# Patient Record
Sex: Male | Born: 1950 | Race: White | Hispanic: No | Marital: Married | Smoking: Never smoker
Health system: Southern US, Community
[De-identification: ages and names within clinical notes are randomized; demographics above are authoritative.]

## PROBLEM LIST (undated history)

## (undated) DIAGNOSIS — J189 Pneumonia, unspecified organism: Secondary | ICD-10-CM

## (undated) DIAGNOSIS — M1990 Unspecified osteoarthritis, unspecified site: Secondary | ICD-10-CM

## (undated) DIAGNOSIS — C801 Malignant (primary) neoplasm, unspecified: Secondary | ICD-10-CM

## (undated) DIAGNOSIS — E785 Hyperlipidemia, unspecified: Secondary | ICD-10-CM

## (undated) HISTORY — PX: TONSILLECTOMY: SUR1361

---

## 2018-12-29 ENCOUNTER — Ambulatory Visit (INDEPENDENT_AMBULATORY_CARE_PROVIDER_SITE_OTHER): Payer: Medicare Other | Admitting: Family Medicine

## 2018-12-29 ENCOUNTER — Other Ambulatory Visit: Payer: Self-pay

## 2018-12-29 ENCOUNTER — Ambulatory Visit: Payer: Self-pay

## 2018-12-29 ENCOUNTER — Encounter: Payer: Self-pay | Admitting: *Deleted

## 2018-12-29 VITALS — BP 142/88 | Ht 72.0 in | Wt 193.0 lb

## 2018-12-29 DIAGNOSIS — S8001XA Contusion of right knee, initial encounter: Secondary | ICD-10-CM | POA: Diagnosis not present

## 2018-12-29 DIAGNOSIS — M25561 Pain in right knee: Secondary | ICD-10-CM

## 2018-12-29 DIAGNOSIS — S8000XA Contusion of unspecified knee, initial encounter: Secondary | ICD-10-CM | POA: Insufficient documentation

## 2018-12-29 NOTE — Assessment & Plan Note (Signed)
I believe the patient does have more of a patella contusion.  Discussed with patient in great length.  We discussed if necessary oral anti-inflammatories and icing regimen.  We discussed vitamin D supplementation.  Discussed which activities of doing which was to avoid.  If patient has worsening pain he will give [place] a call but I be surprised if we see anything significant.

## 2018-12-29 NOTE — Progress Notes (Signed)
[name] [place], [place] [address] Phone: [phone] Subjective:   Daniel [name], am serving as a scribe for Dr. Hulan Saas.   CC: right knee pain   [id number]    Daniel [name] is a 68 y.o. male coming in with complaint of right knee pain. Woke up on Sunday night in pain. Anterior pain with flexion. Played golf on Saturday but always plays golf on Saturdays. [name] history of knee pain/injury. Has not been using NSAIDs.       History reviewed. [name] pertinent past medical history. History reviewed. [name] pertinent surgical history. Social History   Socioeconomic History  . Marital status: Married    Spouse name: Not on file  . Number of children: Not on file  . Years of education: Not on file  . Highest education level: Not on file  Occupational History  . Not on file  Social Needs  . Financial resource strain: Not on file  . Food insecurity:    Worry: Not on file    Inability: Not on file  . Transportation needs:    Medical: Not on file    Non-medical: Not on file  Tobacco Use  . Smoking status: Not on file  Substance and Sexual Activity  . Alcohol use: Not on file  . Drug use: Not on file  . Sexual activity: Not on file  Lifestyle  . Physical activity:    Days per week: Not on file    Minutes per session: Not on file  . Stress: Not on file  Relationships  . Social connections:    Talks on phone: Not on file    Gets together: Not on file    Attends religious service: Not on file    Active member of club or organization: Not on file    Attends meetings of clubs or organizations: Not on file    Relationship status: Not on file  Other Topics Concern  . Not on file  Social History Narrative  . Not on file   Not on File History reviewed. [name] pertinent family history.      Current Outpatient Medications (Hematological):  .  cyanocobalamin 1000 MCG tablet, Take 1,000 mcg by mouth daily.  Current Outpatient  Medications (Other):  Marland Kitchen  Multiple Vitamin (MULTIVITAMIN) tablet, Take 1 tablet by mouth daily. .  Red Yeast Rice Extract (RED YEAST RICE PO), Take by mouth. .  Vitamin D, Cholecalciferol, 25 MCG (1000 UT) TABS, Take by mouth.    Past medical history, social, surgical and family history all reviewed in electronic medical record.  [name] pertanent information unless stated regarding to the chief complaint.   Review of Systems:  [name] headache, visual changes, nausea, vomiting, diarrhea, constipation, dizziness, abdominal pain, skin rash, fevers, chills, night sweats, weight loss, swollen lymph nodes, body aches, joint swelling, muscle aches, chest pain, shortness of breath, mood changes.   Objective  Blood pressure (!) 142/88, height 6' (1.829 m), weight 193 lb (87.5 kg). Systems examined below as of    General: [name] apparent distress alert and oriented x3 mood and affect normal, dressed appropriately.  HEENT: Pupils equal, extraocular movements intact  Respiratory: Patient's speak in full sentences and does not appear short of breath  Cardiovascular: [name] lower extremity edema, non tender, [name] erythema  Skin: Warm dry intact with [name] signs of infection or rash on extremities or on axial skeleton.  Abdomen: Soft nontender  Neuro: Cranial nerves II through XII  are intact, neurovascularly intact in all extremities with 2+ DTRs and 2+ pulses.  Lymph: [name] lymphadenopathy of posterior or anterior cervical chain or axillae bilaterally.  Gait normal with good balance and coordination.  MSK:  Non tender with full range of motion and good stability and symmetric strength and tone of shoulders, elbows, wrist, hip and ankles bilaterally.  Knee:right  Normal to inspection with [name] erythema or effusion or obvious bony abnormalities. Palpation normal with [name] warmth, joint line tenderness, patellar tenderness, or condyle tenderness. ROM full in flexion and extension and lower leg rotation. Ligaments with solid  consistent endpoints including ACL, PCL, LCL, MCL. Negative Mcmurray's, Apley's, and Thessalonian tests. painful patellar compression. Patellar glide with moderatecrepitus. Patellar and quadriceps tendons unremarkable. Hamstring and quadriceps strength is normal. Contralateral knee unremarkable  Limited musculoskeletal ultrasound was performed and interpreted by [name]  Right knee exam shows the patient does have what appears to be a very small contusion of the patella noted.  [name] cortical defect noted.  Mild increase in Doppler flow.  Significant irritation of the fat pad also noted.    Impression and Recommendations:     This case required medical decision making of moderate complexity. The above documentation has been reviewed and is accurate and complete [name], DO       Note: This dictation was prepared with Dragon dictation along with smaller phrase technology. Any transcriptional errors that result from this process are unintentional.
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
## 2019-04-16 ENCOUNTER — Ambulatory Visit: Payer: Self-pay

## 2019-04-16 ENCOUNTER — Other Ambulatory Visit: Payer: Self-pay

## 2019-04-16 ENCOUNTER — Ambulatory Visit (INDEPENDENT_AMBULATORY_CARE_PROVIDER_SITE_OTHER): Payer: Medicare Other | Admitting: Family Medicine

## 2019-04-16 VITALS — BP 122/88 | HR 101 | Ht 72.0 in

## 2019-04-16 DIAGNOSIS — M7711 Lateral epicondylitis, right elbow: Secondary | ICD-10-CM | POA: Diagnosis not present

## 2019-04-16 DIAGNOSIS — M25521 Pain in right elbow: Secondary | ICD-10-CM | POA: Diagnosis not present

## 2019-04-16 NOTE — Progress Notes (Signed)
[name] [place], [place] [address] Phone: [phone] Subjective:   Daniel [name], am serving as a scribe for Dr. Hulan [name].   CC: Right elbow pain  [id number]     Update 04/16/2019 Daniel [name] is a 68 y.o. male coming in with complaint of right knee pain. Patient states his knee pain is good.   Patient is now having right elbow pain. Pain over lateral epicondyle for one month.Pain began after fly fishing after moving arm into casting position: horizontal adduction, into elbow extension and wrist extension. Notices pain with weighted activity. Denies any radiating symptoms.     [name] past medical history on file. [name] past surgical history on file. Social History   Socioeconomic History  . Marital status: Married    Spouse name: Not on file  . Number of children: Not on file  . Years of education: Not on file  . Highest education level: Not on file  Occupational History  . Not on file  Social Needs  . Financial resource strain: Not on file  . Food insecurity    Worry: Not on file    Inability: Not on file  . Transportation needs    Medical: Not on file    Non-medical: Not on file  Tobacco Use  . Smoking status: Not on file  Substance and Sexual Activity  . Alcohol use: Not on file  . Drug use: Not on file  . Sexual activity: Not on file  Lifestyle  . Physical activity    Days per week: Not on file    Minutes per session: Not on file  . Stress: Not on file  Relationships  . Social Herbalist on phone: Not on file    Gets together: Not on file    Attends religious service: Not on file    Active member of club or organization: Not on file    Attends meetings of clubs or organizations: Not on file    Relationship status: Not on file  Other Topics Concern  . Not on file  Social History Narrative  . Not on file   Not on File [name] family history on file.      Current Outpatient Medications  (Hematological):  .  cyanocobalamin 1000 MCG tablet, Take 1,000 mcg by mouth daily.  Current Outpatient Medications (Other):  Marland Kitchen  Multiple Vitamin (MULTIVITAMIN) tablet, Take 1 tablet by mouth daily. .  Red Yeast Rice Extract (RED YEAST RICE PO), Take by mouth. .  Vitamin D, Cholecalciferol, 25 MCG (1000 UT) TABS, Take by mouth.    Past medical history, social, surgical and family history all reviewed in electronic medical record.  [name] pertanent information unless stated regarding to the chief complaint.   Review of Systems:  [name] headache, visual changes, nausea, vomiting, diarrhea, constipation, dizziness, abdominal pain, skin rash, fevers, chills, night sweats, weight loss, swollen lymph nodes, body aches, joint swelling, muscle aches, chest pain, shortness of breath, mood changes.   Objective  Blood pressure 122/88, pulse (!) 101, height 6' (1.829 m), SpO2 98 %.   General: [name] apparent distress alert and oriented x3 mood and affect normal, dressed appropriately.  HEENT: Pupils equal, extraocular movements intact  Respiratory: Patient's speak in full sentences and does not appear short of breath  Cardiovascular: [name] lower extremity edema, non tender, [name] erythema  Skin: Warm dry intact with [name] signs of infection or rash on extremities or on axial skeleton.  Abdomen: Soft nontender  Neuro: Cranial nerves II through XII are intact, neurovascularly intact in all extremities with 2+ DTRs and 2+ pulses.  Lymph: [name] lymphadenopathy of posterior or anterior cervical chain or axillae bilaterally.  Gait normal with good balance and coordination.  MSK:  Non tender with full range of motion and good stability and symmetric strength and tone of shoulders,  wrist, hip, knee and ankles bilaterally.  Mild arthritic changes Right elbow tender to palpation over the lateral epicondylar region laterally as well as the radial nerve in the mid substance of the forearm.  Patient has some mild pain with resisted  extension of the wrist.  Good grip strength.  Full range of motion of the elbow though noted.  Limited musculoskeletal ultrasound was performed and interpreted by Daniel [name]  Limited ultrasound of the lateral epicondylar region shows the patient does have mild hypoechoic changes but [name] true tear.  Potential scar tissue from a previous tear noted.  [name] cortical defect or avulsion of the lateral epicondyle Impression: Lateral epicondylitis    Impression and Recommendations:     This case required medical decision making of moderate complexity. The above documentation has been reviewed and is accurate and complete Daniel Pulley, DO       Note: This dictation was prepared with Dragon dictation along with smaller phrase technology. Any transcriptional errors that result from this process are unintentional.

## 2019-04-16 NOTE — Patient Instructions (Signed)
Brace day and night for 2 weeks and then just at night for 2 weeks Ice 20 min 2x daily No overhand lifting Exercises 3x a week See me in 4-6 weeks

## 2019-04-17 ENCOUNTER — Encounter: Payer: Self-pay | Admitting: Family Medicine

## 2019-04-17 DIAGNOSIS — M7711 Lateral epicondylitis, right elbow: Secondary | ICD-10-CM | POA: Insufficient documentation

## 2019-04-17 NOTE — Assessment & Plan Note (Signed)
Elbow anatomy was reviewed, and tendinopathy was explained.  Pt. given a home rehab program. Start with isometrics and ROM, then a series of concentric and eccentric exercises should be done starting with no weight, work up to 1 lb, hammer, etc.  Use counterforce strap if working or using hands.  Formal PT would be beneficial. Emphasized stretching an cross-friction massage Emphasized proper palms up lifting biomechanics to unload ECRB Worsening symptoms will consider injection and formal physical therapy

## 2019-05-13 ENCOUNTER — Ambulatory Visit: Payer: Medicare Other | Admitting: Family Medicine

## 2019-05-27 ENCOUNTER — Ambulatory Visit (INDEPENDENT_AMBULATORY_CARE_PROVIDER_SITE_OTHER): Payer: Medicare Other | Admitting: Family Medicine

## 2019-05-27 ENCOUNTER — Ambulatory Visit: Payer: Self-pay

## 2019-05-27 ENCOUNTER — Other Ambulatory Visit: Payer: Self-pay

## 2019-05-27 ENCOUNTER — Encounter: Payer: Self-pay | Admitting: Family Medicine

## 2019-05-27 VITALS — BP 122/86 | HR 92 | Ht 72.0 in | Wt 196.0 lb

## 2019-05-27 DIAGNOSIS — M25521 Pain in right elbow: Secondary | ICD-10-CM | POA: Diagnosis not present

## 2019-05-27 DIAGNOSIS — M7711 Lateral epicondylitis, right elbow: Secondary | ICD-10-CM

## 2019-05-27 NOTE — Assessment & Plan Note (Signed)
Lateral epicondylitis.  Still having some discomfort, patient will have been fairly noncompliant with the home exercises and continues to the same activities that seem to be causing some of the discomfort and pain.  Patient wants to try again for another 6 weeks before more aggressive therapy.  Patient will consider the possibility of PRP.  Follow-up again in 6 weeks

## 2019-05-27 NOTE — Progress Notes (Signed)
[name] [place], [place] [address] Phone: [phone] Subjective:   Daniel [name], am serving as a scribe for Dr. Hulan Saas.  I'm seeing this patient by the request  of:    CC: Right elbow pain follow-up  [id number]   04/16/2019 Pt. given a home rehab program. Start with isometrics and ROM, then a series of concentric and eccentric exercises should be done starting with [name] weight, work up to 1 lb, hammer, etc.  Use counterforce strap if working or using hands.  Formal PT would be beneficial. Emphasized stretching an cross-friction massage Emphasized proper palms up lifting biomechanics to unload ECRB Worsening symptoms will consider injection and formal physical therapy  Update 05/27/2019 Daniel [name] is a 68 y.o. male coming in with complaint of right elbow pain. Patient states that he is doing somewhat better. Is still having pain with overhand grasping. Did golf and go trout fishing. Wears the brace when fishing. Does still have pain with backhand throws. Feels he may never get over the pain.  Patient states that he did the exercises more and did not do all the repetitive activity he thinks he would be doing better.  Would state overall maybe not as much pain with regular daily activities     [name] past medical history on file. [name] past surgical history on file. Social History   Socioeconomic History  . Marital status: Married    Spouse name: Not on file  . Number of children: Not on file  . Years of education: Not on file  . Highest education level: Not on file  Occupational History  . Not on file  Social Needs  . Financial resource strain: Not on file  . Food insecurity    Worry: Not on file    Inability: Not on file  . Transportation needs    Medical: Not on file    Non-medical: Not on file  Tobacco Use  . Smoking status: Not on file  Substance and Sexual Activity  . Alcohol use: Not on file  . Drug use: Not  on file  . Sexual activity: Not on file  Lifestyle  . Physical activity    Days per week: Not on file    Minutes per session: Not on file  . Stress: Not on file  Relationships  . Social Herbalist on phone: Not on file    Gets together: Not on file    Attends religious service: Not on file    Active member of club or organization: Not on file    Attends meetings of clubs or organizations: Not on file    Relationship status: Not on file  Other Topics Concern  . Not on file  Social History Narrative  . Not on file   Not on File [name] family history on file.      Current Outpatient Medications (Hematological):  .  cyanocobalamin 1000 MCG tablet, Take 1,000 mcg by mouth daily.  Current Outpatient Medications (Other):  Marland Kitchen  Multiple Vitamin (MULTIVITAMIN) tablet, Take 1 tablet by mouth daily. .  Red Yeast Rice Extract (RED YEAST RICE PO), Take by mouth. .  Vitamin D, Cholecalciferol, 25 MCG (1000 UT) TABS, Take by mouth.    Past medical history, social, surgical and family history all reviewed in electronic medical record.  [name] pertanent information unless stated regarding to the chief complaint.   Review of Systems:  [name] headache, visual changes, nausea, vomiting,  diarrhea, constipation, dizziness, abdominal pain, skin rash, fevers, chills, night sweats, weight loss, swollen lymph nodes, body aches, joint swelling, chest pain, shortness of breath, mood changes.  Positive muscle aches  Objective  Blood pressure 122/86, pulse 92, height 6' (1.829 m), weight 196 lb (88.9 kg), SpO2 97 %.    General: [name] apparent distress alert and oriented x3 mood and affect normal, dressed appropriately.  HEENT: Pupils equal, extraocular movements intact  Respiratory: Patient's speak in full sentences and does not appear short of breath  Cardiovascular: [name] lower extremity edema, non tender, [name] erythema  Skin: Warm dry intact with [name] signs of infection or rash on extremities or on axial  skeleton.  Abdomen: Soft nontender  Neuro: Cranial nerves II through XII are intact, neurovascularly intact in all extremities with 2+ DTRs and 2+ pulses.  Lymph: [name] lymphadenopathy of posterior or anterior cervical chain or axillae bilaterally.  Gait normal with good balance and coordination.  MSK:  Non tender with full range of motion and good stability and symmetric strength and tone of shoulders,  wrist, hip, knee and ankles bilaterally.  Right elbow still tender to palpation over the lateral epicondylar region.  [name] swelling noted.  Mild pain that is increased with resisted wrist extension  Limited musculoskeletal ultrasound was performed and interpreted by [name]  Limited ultrasound shows the patient does have some mild intrasubstance tearing noted of the lateral epicondylar region at the common extensor tendon.  [name] real retraction.  Mild increase in Doppler flow.  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   Impression and Recommendations:      The above documentation has been reviewed and is accurate and complete [name], DO       Note: This dictation was prepared with Dragon dictation along with smaller phrase technology. Any transcriptional errors that result from this process are unintentional.

## 2019-05-27 NOTE — Patient Instructions (Signed)
Read about PRP Continue everything else See me in 6 weeks but if considering PRP sooner call us

## 2019-07-13 ENCOUNTER — Encounter: Payer: Self-pay | Admitting: Family Medicine

## 2019-07-13 ENCOUNTER — Ambulatory Visit (INDEPENDENT_AMBULATORY_CARE_PROVIDER_SITE_OTHER): Payer: Medicare Other | Admitting: Family Medicine

## 2019-07-13 ENCOUNTER — Other Ambulatory Visit: Payer: Self-pay

## 2019-07-13 DIAGNOSIS — M7711 Lateral epicondylitis, right elbow: Secondary | ICD-10-CM

## 2019-07-13 NOTE — Assessment & Plan Note (Signed)
Patient has made significant improvement but continues to have some mild discomfort and pain.  We discussed with patient icing regimen, home exercise, which activities to do which wants to avoid.  Patient is to increase activity slowly over the course of the next several weeks.  Follow-up again in 4 to 8 weeks if patient is greater than 95% better though can follow-up as needed

## 2019-07-13 NOTE — Progress Notes (Signed)
[name] [place], [place] [address] Phone: [phone] Subjective:   I Daniel [name] am serving as a Education administrator for Dr. Hulan Saas.  This visit occurred during the SARS-CoV-2 public health emergency.  Safety protocols were in place, including screening questions prior to the visit, additional usage of staff PPE, and extensive cleaning of exam room while observing appropriate contact time as indicated for disinfecting solutions.    CC: Right elbow pain follow-up  [id number]   05/27/2019 Lateral epicondylitis.  Still having some discomfort, patient will have been fairly noncompliant with the home exercises and continues to the same activities that seem to be causing some of the discomfort and pain.  Patient wants to try again for another 6 weeks before more aggressive therapy.  Patient will consider the possibility of PRP.  Follow-up again in 6 weeks  07/13/2019 Daniel [name] is a 68 y.o. male coming in with complaint of right elbow pain. Patient states he is doing well.  States 90% better patient states daily activities have become much easier.     No past medical history on file. No past surgical history on file. Social History   Socioeconomic History  . Marital status: Married    Spouse name: Not on file  . Number of children: Not on file  . Years of education: Not on file  . Highest education level: Not on file  Occupational History  . Not on file  Social Needs  . Financial resource strain: Not on file  . Food insecurity    Worry: Not on file    Inability: Not on file  . Transportation needs    Medical: Not on file    Non-medical: Not on file  Tobacco Use  . Smoking status: Not on file  Substance and Sexual Activity  . Alcohol use: Not on file  . Drug use: Not on file  . Sexual activity: Not on file  Lifestyle  . Physical activity    Days per week: Not on file    Minutes per session: Not on file  . Stress: Not on file   Relationships  . Social Herbalist on phone: Not on file    Gets together: Not on file    Attends religious service: Not on file    Active member of club or organization: Not on file    Attends meetings of clubs or organizations: Not on file    Relationship status: Not on file  Other Topics Concern  . Not on file  Social History Narrative  . Not on file   Not on File No family history on file.  No family history of autoimmune    Past medical history, social, surgical and family history all reviewed in electronic medical record.  No pertanent information unless stated regarding to the chief complaint.   Review of Systems:  No headache, visual changes, nausea, vomiting, diarrhea, constipation, dizziness, abdominal pain, skin rash, fevers, chills, night sweats, weight loss, swollen lymph nodes, body aches, joint swelling, muscle aches, chest pain, shortness of breath, mood changes.   Objective  Blood pressure 120/84, pulse 83, height 6' (1.829 m), weight 198 lb (89.8 kg), SpO2 95 %. Systems examined below as of    General: No apparent distress alert and oriented x3 mood and affect normal, dressed appropriately.  HEENT: Pupils equal, extraocular movements intact  Respiratory: Patient's speak in full sentences and does not appear short of breath  Cardiovascular: No  lower extremity edema, non tender, no erythema  Skin: Warm dry intact with no signs of infection or rash on extremities or on axial skeleton.  Abdomen: Soft nontender  Neuro: Cranial nerves II through XII are intact, neurovascularly intact in all extremities with 2+ DTRs and 2+ pulses.  Lymph: No lymphadenopathy of posterior or anterior cervical chain or axillae bilaterally.  Gait normal with good balance and coordination.  MSK:  Non tender with full range of motion and good stability and symmetric strength and tone of shoulders, , wrist, hip, knee and ankles bilaterally.    Right elbow exam shows the patient is  moderately tender to palpation over the lateral epicondylar region.  Full range of motion with full flexion and extension.  Impression and Recommendations:      The above documentation has been reviewed and is accurate and complete [name], DO       Note: This dictation was prepared with Dragon dictation along with smaller phrase technology. Any transcriptional errors that result from this process are unintentional.

## 2019-09-24 ENCOUNTER — Ambulatory Visit: Payer: Medicare Other | Attending: Internal Medicine

## 2020-01-04 ENCOUNTER — Ambulatory Visit (INDEPENDENT_AMBULATORY_CARE_PROVIDER_SITE_OTHER): Payer: Medicare Other | Admitting: Family Medicine

## 2020-01-04 ENCOUNTER — Other Ambulatory Visit: Payer: Self-pay

## 2020-01-04 ENCOUNTER — Encounter: Payer: Self-pay | Admitting: Family Medicine

## 2020-01-04 DIAGNOSIS — M79661 Pain in right lower leg: Secondary | ICD-10-CM | POA: Diagnosis not present

## 2020-01-04 NOTE — Patient Instructions (Signed)
Heel lift on [facility] 1/8th-1/16th  Wear knee compression with activity Calf exercises 3 times a week Watch the back see me again in 5-6 weeks

## 2020-01-04 NOTE — Assessment & Plan Note (Signed)
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 Discussed with patient about compression, home exercise, heel lift, icing regimen. Proper shoes. We discussed different medications which patient declined at the moment. Patient work with Product/process development scientist to learn home exercises in greater detail. Patient will increase activity slowly. Follow-up with me again in 4 to 6 weeks.

## 2020-01-04 NOTE — Progress Notes (Signed)
[place] Phone: [phone] Subjective:   Daniel [name], am serving as a scribe for Dr. Hulan Saas. This visit occurred during the SARS-CoV-2 public health emergency.  Safety protocols were in place, including screening questions prior to the visit, additional usage of staff PPE, and extensive cleaning of exam room while observing appropriate contact time as indicated for disinfecting solutions.   I'm seeing this patient by the request  of:  Willey Blade, MD  CC: Right calf pain  [id number]  Daniel [name] is a 69 y.o. male coming in with complaint of hip pain. Patient states that he has been having right calf pain for 2 weeks. Was golfing and sleeping on a couch during the tournament. Has tried to golf but swing made pain worse. Was able to play on Sunday without pain. Pain is achy. Patient states that sometimes he can be without complaint pain. Does note some mild tightness at night. Denies any significant back pain on a regular basis at the moment.       [name] past medical history on file. [name] past surgical history on file. Social History   Socioeconomic History  . Marital status: Married    Spouse name: Not on file  . Number of children: Not on file  . Years of education: Not on file  . Highest education level: Not on file  Occupational History  . Not on file  Tobacco Use  . Smoking status: Not on file  Substance and Sexual Activity  . Alcohol use: Not on file  . Drug use: Not on file  . Sexual activity: Not on file  Other Topics Concern  . Not on file  Social History Narrative  . Not on file   Social Determinants of Health   Financial Resource Strain:   . Difficulty of Paying Living Expenses:   Food Insecurity:   . Worried About Charity fundraiser in the Last Year:   . Arboriculturist in the Last Year:   Transportation Needs:   . Film/video editor (Medical):   Marland Kitchen Lack of  Transportation (Non-Medical):   Physical Activity:   . Days of Exercise per Week:   . Minutes of Exercise per Session:   Stress:   . Feeling of Stress :   Social Connections:   . Frequency of Communication with Friends and Family:   . Frequency of Social Gatherings with Friends and Family:   . Attends Religious Services:   . Active Member of Clubs or Organizations:   . Attends Archivist Meetings:   Marland Kitchen Marital Status:    Not on File [name] family history on file.      Current Outpatient Medications (Hematological):  .  cyanocobalamin 1000 MCG tablet, Take 1,000 mcg by mouth daily.  Current Outpatient Medications (Other):  Marland Kitchen  Multiple Vitamin (MULTIVITAMIN) tablet, Take 1 tablet by mouth daily. .  Red Yeast Rice Extract (RED YEAST RICE PO), Take by mouth. .  Vitamin D, Cholecalciferol, 25 MCG (1000 UT) TABS, Take by mouth.   Reviewed prior external information including notes and imaging from  primary care provider As well as notes that were available from care everywhere and other healthcare systems.  Past medical history, social, surgical and family history all reviewed in electronic medical record.  [name] pertanent information unless stated regarding to the chief complaint.   Review of Systems:  [name] headache, visual changes, nausea, vomiting, diarrhea, constipation,  dizziness, abdominal pain, skin rash, fevers, chills, night sweats, weight loss, swollen lymph nodes, body aches, joint swelling, chest pain, shortness of breath, mood changes. POSITIVE muscle aches  Objective  Blood pressure 112/68, pulse 75, height 6' (1.829 m), weight 194 lb (88 kg), SpO2 99 %.   General: [name] apparent distress alert and oriented x3 mood and affect normal, dressed appropriately.  HEENT: Pupils equal, extraocular movements intact  Respiratory: Patient's speak in full sentences and does not appear short of breath  Cardiovascular: [name] lower extremity edema, non tender, [name] erythema  Neuro:  Cranial nerves II through XII are intact, neurovascularly intact in all extremities with 2+ DTRs and 2+ pulses.  Gait normal with good balance and coordination.  MSK:  Non tender with full range of motion and good stability and symmetric strength and tone of shoulders, elbows, wrist, hip, knee and ankles bilaterally.  Back exam has some very mild loss of lordosis. Negative FABER test. Patient does have some mild pain on the lateral gastroc head. Achilles is intact. Minorly stiff compared to the contralateral side Right calf minimally tender in the lateral gastroc tendon. Fibular head is nontender. Mild positive Tinel's. [name] foot drop noted. 5 out of 5 strength of the ankle noted.  [id number]; 15 additional minutes spent for Therapeutic exercises as stated in above notes.  This included exercises focusing on stretching, strengthening, with significant focus on eccentric aspects.   Long term goals include an improvement in range of motion, strength, endurance as well as avoiding reinjury. Patient's frequency would include in 1-2 times a day, 3-5 times a week for a duration of 6-12 weeks. Ankle strengthening that included:  Basic range of motion exercises to allow proper full motion at ankle Stretching of the lower leg and hamstrings  Theraband exercises for the lower leg - inversion, eversion, dorsiflexion and plantarflexion each to be completed with a theraband Balance exercises to increase proprioception Weight bearing exercises to increase strength and balance  Proper technique shown and discussed handout in great detail with ATC.  All questions were discussed and answered.     Impression and Recommendations:     This case required medical decision making of moderate complexity. The above documentation has been reviewed and is accurate and complete Daniel Pulley, DO       Note: This dictation was prepared with Dragon dictation along with smaller phrase technology. Any transcriptional errors that  result from this process are unintentional.

## 2020-02-08 ENCOUNTER — Encounter: Payer: Self-pay | Admitting: Family Medicine

## 2020-02-08 ENCOUNTER — Ambulatory Visit (INDEPENDENT_AMBULATORY_CARE_PROVIDER_SITE_OTHER): Payer: Medicare Other | Admitting: Family Medicine

## 2020-02-08 ENCOUNTER — Other Ambulatory Visit: Payer: Self-pay

## 2020-02-08 DIAGNOSIS — G5701 Lesion of sciatic nerve, right lower limb: Secondary | ICD-10-CM | POA: Diagnosis not present

## 2020-02-08 DIAGNOSIS — M79661 Pain in right lower leg: Secondary | ICD-10-CM

## 2020-02-08 NOTE — Assessment & Plan Note (Signed)
I believe the patient's right calf pain is more secondary to the piriformis.  We have given some different exercises.  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  Discussed which activities to do which wants to avoid.  Discussed icing regimen.  Patient will increase activity slowly.  Follow-up again in 4-8 weeks.

## 2020-02-08 NOTE — Progress Notes (Signed)
[place] Phone: [phone] Subjective:   Daniel [name], am serving as a scribe for Dr. Hulan Saas. This visit occurred during the SARS-CoV-2 public health emergency.  Safety protocols were in place, including screening questions prior to the visit, additional usage of staff PPE, and extensive cleaning of exam room while observing appropriate contact time as indicated for disinfecting solutions.   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 Discussed with patient about compression, home exercise, heel lift, icing regimen. Proper shoes. We discussed different medications which patient declined at the moment. Patient work with Product/process development scientist to learn home exercises in greater detail. Patient will increase activity slowly. Follow-up with me again in 4 to 6 weeks.  Update 02/08/2020 Daniel [name] is a 69 y.o. male coming in with complaint of right calf pain. States that he hs intermittent pain. IF sitting in hard chair will feel pain in glute all the way to the calf.  Patient states that the sitting seems to be what aggravates it more than actually the activity now.  Patient denies any numbness or tingling that stays around for long amount of time.  Sleeping comfortably.  Patient is working out on a regular basis as well and feels like overall doing decent.     [name] past medical history on file. [name] past surgical history on file. Social History   Socioeconomic History  . Marital status: Married    Spouse name: Not on file  . Number of children: Not on file  . Years of education: Not on file  . Highest education level: Not on file  Occupational History  . Not on file  Tobacco Use  . Smoking status: Not on file  Substance and Sexual Activity  . Alcohol use: Not on file  .  Drug use: Not on file  . Sexual activity: Not on file  Other Topics Concern  . Not on file  Social History Narrative  . Not on file   Social Determinants of Health   Financial Resource Strain:   . Difficulty of Paying Living Expenses:   Food Insecurity:   . Worried About Charity fundraiser in the Last Year:   . Arboriculturist in the Last Year:   Transportation Needs:   . Film/video editor (Medical):   Marland Kitchen Lack of Transportation (Non-Medical):   Physical Activity:   . Days of Exercise per Week:   . Minutes of Exercise per Session:   Stress:   . Feeling of Stress :   Social Connections:   . Frequency of Communication with Friends and Family:   . Frequency of Social Gatherings with Friends and Family:   . Attends Religious Services:   . Active Member of Clubs or Organizations:   . Attends Archivist Meetings:   Marland Kitchen Marital Status:    Not on File [name] family history on file.      Current Outpatient Medications (Hematological):  .  cyanocobalamin 1000 MCG tablet, Take 1,000 mcg by mouth daily.  Current Outpatient Medications (Other):  Marland Kitchen  Multiple Vitamin (MULTIVITAMIN) tablet, Take 1 tablet by mouth daily. .  Red Yeast Rice Extract (RED YEAST RICE PO), Take by mouth. .  Vitamin D, Cholecalciferol, 25 MCG (1000 UT) TABS, Take by mouth.  Reviewed prior external information including notes and imaging from  primary care provider As well as notes that were available from care everywhere and other healthcare systems.  Past medical history, social, surgical and family history all reviewed in electronic medical record.  [name] pertanent information unless stated regarding to the chief complaint.   Review of Systems:  [name] headache, visual changes, nausea, vomiting, diarrhea, constipation, dizziness, abdominal pain, skin rash, fevers, chills, night sweats, weight loss, swollen lymph nodes, body aches, joint swelling, chest pain, shortness of breath, mood changes.  POSITIVE muscle aches  Objective  Blood pressure 118/74, pulse 93, height 6' (1.829 m), weight 196 lb (88.9 kg), SpO2 96 %.   General: [name] apparent distress alert and oriented x3 mood and affect normal, dressed appropriately.  HEENT: Pupils equal, extraocular movements intact  Respiratory: Patient's speak in full sentences and does not appear short of breath  Cardiovascular: [name] lower extremity edema, non tender, [name] erythema  Neuro: Cranial nerves II through XII are intact, neurovascularly intact in all extremities with 2+ DTRs and 2+ pulses.  Gait normal with good balance and coordination.  MSK:  tender with full range of motion and stability and symmetric strength and tone of shoulders, elbows, wrist, hip, knee and ankles bilaterally.  Back exam shows the patient does have some tightness noted in the piriformis with a positive Corky Sox on the right side.  Patient's right calf appears to be fairly unremarkable.  Patient does have very mild tightness of the lateral gastroc head compared to the contralateral side.  Patient able to jump up and down.  Full range of motion of the ankle noted as well as the knee.   Impression and Recommendations:     The above documentation has been reviewed and is accurate and complete [name], DO       Note: This dictation was prepared with Dragon dictation along with smaller phrase technology. Any transcriptional errors that result from this process are unintentional.

## 2020-02-08 NOTE — Assessment & Plan Note (Signed)
Using Netter's Orthopaedic Anatomy, reviewed with the patient the structures involved and how they related to diagnosis. The patient indicated understanding.   The patient was given a handout about classic piriformis stretching including Pigeon Pose, Modified Pigeon Pose, my self-described "Sink Stretch," and other piriformis rehab.  We also reviewed hip flexor and abductor strengthening, ham stretching  Rec deep massage, explained self-massage with ball  

## 2020-02-08 NOTE — Patient Instructions (Signed)
Exercises  See me in 6 weeks

## 2020-03-21 ENCOUNTER — Other Ambulatory Visit: Payer: Self-pay

## 2020-03-21 ENCOUNTER — Ambulatory Visit (INDEPENDENT_AMBULATORY_CARE_PROVIDER_SITE_OTHER): Payer: Medicare Other | Admitting: Family Medicine

## 2020-03-21 ENCOUNTER — Encounter: Payer: Self-pay | Admitting: Family Medicine

## 2020-03-21 DIAGNOSIS — G5701 Lesion of sciatic nerve, right lower limb: Secondary | ICD-10-CM | POA: Diagnosis not present

## 2020-03-21 NOTE — Assessment & Plan Note (Signed)
Patient is doing well with conservative therapy.  No significant change in management.  Patient can follow-up in 6 to 12 weeks

## 2020-03-21 NOTE — Patient Instructions (Signed)
See me again in 6 weeks 

## 2020-03-21 NOTE — Progress Notes (Signed)
[place] Phone: [phone] Subjective:   [name], am serving as a scribe for Dr. Hulan Saas. This visit occurred during the SARS-CoV-2 public health emergency.  Safety protocols were in place, including screening questions prior to the visit, additional usage of staff PPE, and extensive cleaning of exam room while observing appropriate contact time as indicated for disinfecting solutions.   I'm seeing this patient by the request  of:  Willey Blade, MD  CC: Right hip and calf pain follow-up  [id number]   02/08/2020 Using Netter's Orthopaedic Anatomy, reviewed with the patient the structures involved and how they related to diagnosis. The patient indicated understanding.   The patient was given a handout about classic piriformis stretching including [employer], Modified [employer], my self-described "Sink Stretch," and other piriformis rehab.  We also reviewed hip flexor and abductor strengthening, ham stretching  Rec deep massage, explained self-massage with ball  I believe the patient's right calf pain is more secondary to the piriformis.  We have given some different exercises.  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  Discussed which activities to do which wants to avoid.  Discussed icing regimen.  Patient will increase activity slowly.  Follow-up again in 4-8 weeks.  Update 03/21/2020 Daniel [name] is a 69 y.o. male coming in with complaint of right hip and right calf pain. Patient states that his pain increases with sitting but otherwise is doing well.  Doing relatively well.  Feels like he is making progress.  Responding more to the piriformis activities at the moment.     No past medical history on file. No past surgical history on file. Social History   Socioeconomic History  . Marital status: Married    Spouse name:  Not on file  . Number of children: Not on file  . Years of education: Not on file  . Highest education level: Not on file  Occupational History  . Not on file  Tobacco Use  . Smoking status: Not on file  Substance and Sexual Activity  . Alcohol use: Not on file  . Drug use: Not on file  . Sexual activity: Not on file  Other Topics Concern  . Not on file  Social History Narrative  . Not on file   Social Determinants of Health   Financial Resource Strain:   . Difficulty of Paying Living Expenses:   Food Insecurity:   . Worried About Charity fundraiser in the Last Year:   . Arboriculturist in the Last Year:   Transportation Needs:   . Film/video editor (Medical):   Marland Kitchen Lack of Transportation (Non-Medical):   Physical Activity:   . Days of Exercise per Week:   . Minutes of Exercise per Session:   Stress:   . Feeling of Stress :   Social Connections:   . Frequency of Communication with Friends and Family:   . Frequency of Social Gatherings with Friends and Family:   . Attends Religious Services:   . Active Member of Clubs or Organizations:   . Attends Archivist Meetings:   Marland Kitchen Marital Status:    Not on File No family history on file.      Current Outpatient Medications (Hematological):  .  cyanocobalamin 1000 MCG tablet, Take 1,000 mcg by mouth daily.  Current Outpatient Medications (Other):  .  Multiple Vitamin (MULTIVITAMIN) tablet, Take 1 tablet by mouth daily. .  Red Yeast Rice Extract (RED YEAST RICE PO), Take by mouth. .  Vitamin D, Cholecalciferol, 25 MCG (1000 UT) TABS, Take by mouth.   Reviewed prior external information including notes and imaging from  primary care provider As well as notes that were available from care everywhere and other healthcare systems.  Past medical history, social, surgical and family history all reviewed in electronic medical record.  No pertanent information unless stated regarding to the chief complaint.    Review of Systems:  No headache, visual changes, nausea, vomiting, diarrhea, constipation, dizziness, abdominal pain, skin rash, fevers, chills, night sweats, weight loss, swollen lymph nodes, body aches, joint swelling, chest pain, shortness of breath, mood changes. POSITIVE muscle aches  Objective  Blood pressure 130/82, pulse 79, height 6' (1.829 m), SpO2 97 %.   General: No apparent distress alert and oriented x3 mood and affect normal, dressed appropriately.  HEENT: Pupils equal, extraocular movements intact  Respiratory: Patient's speak in full sentences and does not appear short of breath  Cardiovascular: No lower extremity edema, non tender, no erythema  Neuro: Cranial nerves II through XII are intact, neurovascularly intact in all extremities with 2+ DTRs and 2+ pulses.  Gait normal with good balance and coordination.  MSK:  Non tender with full range of motion and good stability and symmetric strength and tone of shoulders, elbows, wrist, hip, knee and ankles bilaterally.  Low back mild tenderness to palpation nothing severe.  Mild tightness with Corky Sox.  Negative straight leg test.  5-5 strength in lower extremities.   Impression and Recommendations:     The above documentation has been reviewed and is accurate and complete [name], DO       Note: This dictation was prepared with Dragon dictation along with smaller phrase technology. Any transcriptional errors that result from this process are unintentional.

## 2020-05-15 ENCOUNTER — Ambulatory Visit (INDEPENDENT_AMBULATORY_CARE_PROVIDER_SITE_OTHER): Payer: Medicare Other | Admitting: Family Medicine

## 2020-05-15 ENCOUNTER — Other Ambulatory Visit: Payer: Self-pay

## 2020-05-15 ENCOUNTER — Encounter: Payer: Self-pay | Admitting: Family Medicine

## 2020-05-15 VITALS — BP 102/76 | HR 72 | Ht 72.0 in | Wt 192.0 lb

## 2020-05-15 DIAGNOSIS — G5701 Lesion of sciatic nerve, right lower limb: Secondary | ICD-10-CM

## 2020-05-15 DIAGNOSIS — M999 Biomechanical lesion, unspecified: Secondary | ICD-10-CM | POA: Diagnosis not present

## 2020-05-15 NOTE — Assessment & Plan Note (Signed)
   Decision today to treat with OMT was based on Physical Exam  After verbal consent patient was treated with HVLA, ME, techniques in lumbar and sacral areas, all areas are chronic   Patient tolerated the procedure well with improvement in symptoms  Patient given exercises, stretches and lifestyle modifications  See medications in patient instructions if given  Patient will follow up in 4-8 weeks

## 2020-05-15 NOTE — Progress Notes (Signed)
[place] Phone: [phone] Subjective:   Daniel [name], am serving as a scribe for Dr. Hulan Saas. This visit occurred during the SARS-CoV-2 public health emergency.  Safety protocols were in place, including screening questions prior to the visit, additional usage of staff PPE, and extensive cleaning of exam room while observing appropriate contact time as indicated for disinfecting solutions.   I'm seeing this patient by the request  of:  Daniel Blade, MD  CC: Right hip pain  [id number]   03/21/2020 Patient is doing well with conservative therapy.  [name] significant change in management.  Patient can follow-up in 6 to 12 weeks  Update 05/15/2020 Daniel [name] is a 69 y.o. male coming in with complaint of right hip pain. Patient states overall doing relatively well.  Some mild discomfort and pain here and there but nothing that stops him from activity.  As long as patient does the exercises he feels like he does relatively well.  Patient denies any weakness.  Denies any difficulty with sleep at the moment.    [name] past medical history on file. [name] past surgical history on file. Social History   Socioeconomic History  . Marital status: Married    Spouse name: Not on file  . Number of children: Not on file  . Years of education: Not on file  . Highest education level: Not on file  Occupational History  . Not on file  Tobacco Use  . Smoking status: Not on file  Substance and Sexual Activity  . Alcohol use: Not on file  . Drug use: Not on file  . Sexual activity: Not on file  Other Topics Concern  . Not on file  Social History Narrative  . Not on file   Social Determinants of Health   Financial Resource Strain:   . Difficulty of Paying Living Expenses: Not on file  Food Insecurity:   . Worried About Charity fundraiser in the Last Year: Not on file  . Ran Out of Food in the Last Year: Not on  file  Transportation Needs:   . Lack of Transportation (Medical): Not on file  . Lack of Transportation (Non-Medical): Not on file  Physical Activity:   . Days of Exercise per Week: Not on file  . Minutes of Exercise per Session: Not on file  Stress:   . Feeling of Stress : Not on file  Social Connections:   . Frequency of Communication with Friends and Family: Not on file  . Frequency of Social Gatherings with Friends and Family: Not on file  . Attends Religious Services: Not on file  . Active Member of Clubs or Organizations: Not on file  . Attends Archivist Meetings: Not on file  . Marital Status: Not on file   Not on File [name] family history on file.      Current Outpatient Medications (Hematological):  .  cyanocobalamin 1000 MCG tablet, Take 1,000 mcg by mouth daily.  Current Outpatient Medications (Other):  Marland Kitchen  Multiple Vitamin (MULTIVITAMIN) tablet, Take 1 tablet by mouth daily. .  Red Yeast Rice Extract (RED YEAST RICE PO), Take by mouth. .  Vitamin D, Cholecalciferol, 25 MCG (1000 UT) TABS, Take by mouth.   Reviewed prior external information including notes and imaging from  primary care provider As well as notes that were available from care everywhere and other healthcare systems.  Past medical history, social, surgical and family  history all reviewed in electronic medical record.  [name] pertanent information unless stated regarding to the chief complaint.   Review of Systems:  [name] headache, visual changes, nausea, vomiting, diarrhea, constipation, dizziness, abdominal pain, skin rash, fevers, chills, night sweats, weight loss, swollen lymph nodes, body aches, joint swelling, chest pain, shortness of breath, mood changes. POSITIVE muscle aches  Objective  Blood pressure 102/76, pulse 72, height 6' (1.829 m), weight 192 lb (87.1 kg), SpO2 98 %.   General: [name] apparent distress alert and oriented x3 mood and affect normal, dressed appropriately.  HEENT:  Pupils equal, extraocular movements intact  Respiratory: Patient's speak in full sentences and does not appear short of breath  Cardiovascular: [name] lower extremity edema, non tender, [name] erythema  Neuro: Cranial nerves II through XII are intact, neurovascularly intact in all extremities with 2+ DTRs and 2+ pulses.  Gait normal with good balance and coordination.  MSK: Back exam does have some mild loss of lordosis, some tightness noted to [facility] on the right side.  Minimal internal range of motion of the hips bilaterally.  Low back loss of lordosis noted.  Negative straight leg test 5 out of 5 strength of the lower extremities  Osteopathic findings  L2 flexed rotated and side bent right Sacrum right on right     Impression and Recommendations:     The above documentation has been reviewed and is accurate and complete [name], DO

## 2020-05-15 NOTE — Assessment & Plan Note (Signed)
Mostly piriformis syndrome, discussed that it could be some secondary to the back.  We discussed home exercises and icing regimen, which activities to do which wants to avoid..  Follow-up with me again in 4 to 8 weeks

## 2020-05-15 NOTE — Patient Instructions (Signed)
See me in 6 weeks

## 2020-06-27 ENCOUNTER — Telehealth (HOSPITAL_COMMUNITY): Payer: Self-pay

## 2020-06-27 ENCOUNTER — Other Ambulatory Visit: Payer: Self-pay | Admitting: Oncology

## 2020-06-27 ENCOUNTER — Ambulatory Visit: Payer: Medicare Other | Admitting: Family Medicine

## 2020-06-27 DIAGNOSIS — U071 COVID-19: Secondary | ICD-10-CM

## 2020-06-27 NOTE — Progress Notes (Signed)
I connected by phone with  Daniel [name] to discuss the potential use of an new treatment for mild to moderate COVID-19 viral infection in non-hospitalized patients.   This patient is a age/sex that meets the FDA criteria for Emergency Use Authorization of casirivimab\imdevimab.  Has a (+) direct SARS-CoV-2 viral test result 1. Has mild or moderate COVID-19  2. Is ? 69 years of age and weighs ? 40 kg 3. Is NOT hospitalized due to COVID-19 4. Is NOT requiring oxygen therapy or requiring an increase in baseline oxygen flow rate due to COVID-19 5. Is within 10 days of symptom onset 6. Has at least one of the high risk factor(s) for progression to severe COVID-19 and/or hospitalization as defined in EUA. ? Specific high risk criteria :No past medical history on file. ?  Risk factors include: age and BMI >25.   Sx onset 11/12 with fatigue, SOB and congestion.   Pt has not been tested for COVID but lives in household with his wife, who was confirmed COVID   positive 06/23/20; and has already had antibody infusion.  Symptom onset  06/23/20   I have spoken and communicated the following to the patient or parent/caregiver:   1. FDA has authorized the emergency use of casirivimab\imdevimab for the treatment of mild to moderate COVID-19 in adults and pediatric patients with positive results of direct SARS-CoV-2 viral testing who are 66 years of age and older weighing at least 40 kg, and who are at high risk for progressing to severe COVID-19 and/or hospitalization.   2. The significant known and potential risks and benefits of casirivimab\imdevimab, and the extent to which such potential risks and benefits are unknown.   3. Information on available alternative treatments and the risks and benefits of those alternatives, including clinical trials.   4. Patients treated with casirivimab\imdevimab should continue to self-isolate and use infection control measures (e.g., wear mask, isolate, social distance,  avoid sharing personal items, clean and disinfect "high touch" surfaces, and frequent handwashing) according to CDC guidelines.    5. The patient or parent/caregiver has the option to accept or refuse casirivimab\imdevimab .   After reviewing this information with the patient, The patient agreed to proceed with receiving casirivimab\imdevimab infusion and will be provided a copy of the Fact sheet prior to receiving the infusion.Rulon Abide, AGNP-C [phone] ([place])

## 2020-06-27 NOTE — Telephone Encounter (Signed)
Called to Discuss with patient about Covid symptoms and the use of the monoclonal antibody infusion for those with mild to moderate Covid symptoms and at a high risk of hospitalization.     Pt appears to qualify for this infusion due to co-morbid conditions and/or a member of an at-risk group in accordance with the FDA Emergency Use Authorization.    Risk factors include: age and BMI >25. Sx onset 11/12 with fatigue, SOB and congestion. Pt has not been tested for COVID but lives in household with his wife, who was confirmed COVID positive 06/23/20; and has already had antibody infusion.  Pre-screened by RN and ready for APP to call and further discuss and/or schedule appt.  Pt verbalizes understanding of estimated costs for treatment.

## 2020-06-28 ENCOUNTER — Ambulatory Visit (HOSPITAL_COMMUNITY)
Admission: RE | Admit: 2020-06-28 | Discharge: 2020-06-28 | Disposition: A | Discharge status: Home / Self Care | Payer: Medicare Other | Source: Ambulatory Visit | Attending: Pulmonary Disease | Admitting: Pulmonary Disease

## 2020-06-28 DIAGNOSIS — Z23 Encounter for immunization: Secondary | ICD-10-CM | POA: Diagnosis not present

## 2020-06-28 DIAGNOSIS — U071 COVID-19: Secondary | ICD-10-CM | POA: Diagnosis present

## 2020-06-28 MED ORDER — ALBUTEROL SULFATE HFA 108 (90 BASE) MCG/ACT IN AERS: 2.0000 | INHALATION_SPRAY | Freq: Once | RESPIRATORY_TRACT | Status: DC | PRN

## 2020-06-28 MED ORDER — FAMOTIDINE IN NACL 20-0.9 MG/50ML-% IV SOLN: 20.0000 mg | Freq: Once | INTRAVENOUS | Status: DC | PRN

## 2020-06-28 MED ORDER — METHYLPREDNISOLONE SODIUM SUCC 125 MG IJ SOLR: 125.0000 mg | Freq: Once | INTRAMUSCULAR | Status: DC | PRN

## 2020-06-28 MED ORDER — DIPHENHYDRAMINE HCL 50 MG/ML IJ SOLN: 50.0000 mg | Freq: Once | INTRAMUSCULAR | Status: DC | PRN

## 2020-06-28 MED ORDER — SOTROVIMAB 500 MG/8ML IV SOLN
500.0000 mg | Freq: Once | INTRAVENOUS | Status: AC
  Administered 2020-06-28: 500 mg via INTRAVENOUS

## 2020-06-28 MED ORDER — EPINEPHRINE 0.3 MG/0.3ML IJ SOAJ: 0.3000 mg | Freq: Once | INTRAMUSCULAR | Status: DC | PRN

## 2020-06-28 MED ORDER — SODIUM CHLORIDE 0.9 % IV SOLN: INTRAVENOUS | Status: DC | PRN

## 2020-06-28 NOTE — Discharge Instructions (Signed)

## 2020-06-28 NOTE — Progress Notes (Signed)
Diagnosis: COVID-19  Physician: Dr. Patrick Wright  Procedure: Covid Infusion Clinic Med: Sotrovimab infusion - Provided patient with sotrovimab fact sheet for patients, parents, and caregivers prior to infusion.   Complications: No immediate complications noted  Discharge: Discharged home  If after the infusion you have any questions or concerns please call the Advanced Practice Provider at [phone] 

## 2020-06-30 ENCOUNTER — Telehealth: Payer: Self-pay | Admitting: Physical Therapy

## 2020-06-30 NOTE — Telephone Encounter (Signed)
Called pt and LM for him to return call to office to re-schedule the appt he has w/ Dr. Georgina Snell on 07/03/20 due to pt being treated for Covid w/ monoclonal antibodies (most recent treatment being on 06/28/20).  Per Dr. Georgina Snell, pt needs to wait x 2 weeks to re-schedule.

## 2020-07-03 ENCOUNTER — Ambulatory Visit: Payer: Medicare Other | Admitting: Family Medicine

## 2020-07-12 ENCOUNTER — Ambulatory Visit: Payer: Medicare Other | Admitting: Family Medicine

## 2020-09-12 NOTE — Progress Notes (Signed)
[place] Phone: [phone] Subjective:   Daniel [name], am serving as a scribe for Dr. Hulan [name]. This visit occurred during the SARS-CoV-2 public health emergency.  Safety protocols were in place, including screening questions prior to the visit, additional usage of staff PPE, and extensive cleaning of exam room while observing appropriate contact time as indicated for disinfecting solutions.   I'm seeing this patient by the request  of:  Daniel Blade, MD  CC: Neck and back pain follow-up  LOV:[id number]  Daniel [name] is a 70 y.o. male coming in with complaint of back and neck pain. OMT 05/15/2020. Patient states that his pain in the same as last visit. Feels like his pain is [name] better and [name] worse. Does use HEP to help maintain flexibility. Sitting increase his pain going down right leg. Can golf with out pain.  Patient also complains of an inguinal hernia.  Has had this for years but feels like it is growing.  States that now about 5% to 10% of the time does have same symptoms.  Patient is wondering if it is now time to get it fixed  Medications patient has been prescribed: None          Reviewed prior external information including notes and imaging from previsou exam, outside providers and external EMR if available.   As well as notes that were available from care everywhere and other healthcare systems.  Past medical history, social, surgical and family history all reviewed in electronic medical record.  [name] pertanent information unless stated regarding to the chief complaint.   [name] past medical history on file.  Not on File   Review of Systems:  [name] headache, visual changes, nausea, vomiting, diarrhea, constipation, dizziness, abdominal pain, skin rash, fevers, chills, night sweats, weight loss, swollen lymph nodes, body aches, joint swelling, chest pain, shortness of breath, mood changes.  POSITIVE muscle aches  Objective  Blood pressure 104/78, pulse 77, height 6' (1.829 m), weight 197 lb (89.4 kg), SpO2 96 %.   General: [name] apparent distress alert and oriented x3 mood and affect normal, dressed appropriately.  HEENT: Pupils equal, extraocular movements intact  Respiratory: Patient's speak in full sentences and does not appear short of breath  Cardiovascular: [name] lower extremity edema, non tender, [name] erythema  Gait normal with good balance and coordination.  MSK:  Non tender with full range of motion and good stability and symmetric strength and tone of shoulders, elbows, wrist, hip, knee and ankles bilaterally.  Back -back exam does have some mild loss of lordosis.  Patient does have tightness of the [facility] on the right greater than left.  Worsening radicular symptoms with Faber's and with straight leg test.  Patient though has 5/5 strength of the lower extremity.  Osteopathic findings   L2 flexed rotated and side bent right Sacrum right on right       Assessment and Plan:  Piriformis syndrome of right side Seems to be more piriformis but patient does have some tightness of the lumbar spine.  We will get x-rays to further evaluate.  Discussed home exercises and icing regimen.  Patient still has intermittent radicular symptoms but seems to be worse with sitting that goes against of the lumbar aspect and more of the piriformis.  Patient does respond fairly well to OMT and will follow up again in 6 weeks  Inguinal hernia Patient states has had this for some time but has  grown recently and becoming minorly symptomatic.  Given different signs and symptoms and when to seek medical attention below we will refer to Daniel [name] surgery to discuss potential surgical intervention    Nonallopathic problems  Decision today to treat with OMT was based on Physical Exam  After verbal consent patient was treated with HVLA, ME, FPR techniques in  lumbar, and sacral   areas  Patient tolerated the procedure well with improvement in symptoms  Patient given exercises, stretches and lifestyle modifications  See medications in patient instructions if given  Patient will follow up in 4-8 weeks      The above documentation has been reviewed and is accurate and complete Daniel Pulley, DO       Note: This dictation was prepared with Dragon dictation along with smaller phrase technology. Any transcriptional errors that result from this process are unintentional.

## 2020-09-13 ENCOUNTER — Encounter: Payer: Self-pay | Admitting: Family Medicine

## 2020-09-13 ENCOUNTER — Other Ambulatory Visit: Payer: Self-pay

## 2020-09-13 ENCOUNTER — Ambulatory Visit (INDEPENDENT_AMBULATORY_CARE_PROVIDER_SITE_OTHER): Payer: Medicare Other

## 2020-09-13 ENCOUNTER — Ambulatory Visit (INDEPENDENT_AMBULATORY_CARE_PROVIDER_SITE_OTHER): Payer: Medicare Other | Admitting: Family Medicine

## 2020-09-13 VITALS — BP 104/78 | HR 77 | Ht 72.0 in | Wt 197.0 lb

## 2020-09-13 DIAGNOSIS — M545 Low back pain, unspecified: Secondary | ICD-10-CM

## 2020-09-13 DIAGNOSIS — M999 Biomechanical lesion, unspecified: Secondary | ICD-10-CM | POA: Diagnosis not present

## 2020-09-13 DIAGNOSIS — K4090 Unilateral inguinal hernia, without obstruction or gangrene, not specified as recurrent: Secondary | ICD-10-CM | POA: Insufficient documentation

## 2020-09-13 DIAGNOSIS — G5701 Lesion of sciatic nerve, right lower limb: Secondary | ICD-10-CM | POA: Diagnosis not present

## 2020-09-13 NOTE — Assessment & Plan Note (Signed)
Seems to be more piriformis but patient does have some tightness of the lumbar spine.  We will get x-rays to further evaluate.  Discussed home exercises and icing regimen.  Patient still has intermittent radicular symptoms but seems to be worse with sitting that goes against of the lumbar aspect and more of the piriformis.  Patient does respond fairly well to OMT and will follow up again in 6 weeks

## 2020-09-13 NOTE — Patient Instructions (Addendum)
Xray today [place] [facility]- They will call you See me again in 6 weeks

## 2020-09-13 NOTE — Assessment & Plan Note (Signed)
Patient states has had this for some time but has grown recently and becoming minorly symptomatic.  Given different signs and symptoms and when to seek medical attention below we will refer to [facility] surgery to discuss potential surgical intervention

## 2020-10-18 ENCOUNTER — Ambulatory Visit: Payer: Self-pay | Admitting: General Surgery

## 2020-10-18 NOTE — H&P (Signed)
History of Present Illness Ralene Ok MD; 10/18/2020 2:50 PM) The patient is a 70 year old male who presents with an inguinal hernia. Referred by: Hulan Saas, M.D. Chief Complaint: Large left inguinal hernia  Patient is a 69 year old male, otherwise healthy comes in with a 2 year history of a left inguinal hernia. Patient states that it slowly gotten larger over the 2 years. Patient is active, does go to the gym on a daily basis as well as plays golf. He does state that he has some discomfort sometimes with bowel movements. He's had no signs or symptoms of incarceration or strangulation.  He's had no previous abdominal surgery.     Past Surgical History Lindwood Coke, RN; 10/18/2020 2:23 PM) Tonsillectomy   Diagnostic Studies History Lindwood Coke, RN; 10/18/2020 2:23 PM) Colonoscopy  1-5 years ago  Allergies Lindwood Coke, RN; 10/18/2020 2:24 PM) No Known Drug Allergies  [10/18/2020]: Allergies Reconciled   Medication History (Diane Herrin, RN; 10/18/2020 2:29 PM) CoQ10 (50MG  Capsule, Oral) Active. Testosterone Cypionate (200MG /ML Solution, Injection) Active. Red Yeast Rice (600MG  Capsule, Oral) Active. L-Carnitine (500MG  Capsule, Oral) Active. Medications Reconciled  Social History Lindwood Coke, RN; 10/18/2020 2:23 PM) Alcohol use  Moderate alcohol use. No caffeine use  No drug use  Tobacco use  Never smoker.  Family History Lindwood Coke, RN; 10/18/2020 2:23 PM) Breast Cancer  Mother. Prostate Cancer  Father.  Other Problems Lindwood Coke, RN; 10/18/2020 2:23 PM) Arthritis  Hemorrhoids  Hypercholesterolemia  Melanoma     Review of Systems Ralene Ok MD; 10/18/2020 2:49 PM) General Not Present- Appetite Loss, Chills, Fatigue, Fever, Night Sweats, Weight Gain and Weight Loss. Skin Not Present- Change in Wart/Mole, Dryness, Hives, Jaundice, New Lesions, Non-Healing Wounds, Rash and Ulcer. HEENT Present- Ringing in the Ears and Seasonal  Allergies. Not Present- Earache, Hearing Loss, Hoarseness, Nose Bleed, Oral Ulcers, Sinus Pain, Sore Throat, Visual Disturbances, Wears glasses/contact lenses and Yellow Eyes. Respiratory Present- Snoring. Not Present- Bloody sputum, Chronic Cough, Difficulty Breathing and Wheezing. Cardiovascular Not Present- Chest Pain, Difficulty Breathing Lying Down, Leg Cramps, Palpitations, Rapid Heart Rate, Shortness of Breath and Swelling of Extremities. Gastrointestinal Present- Hemorrhoids. Not Present- Abdominal Pain, Bloating, Bloody Stool, Change in Bowel Habits, Chronic diarrhea, Constipation, Difficulty Swallowing, Excessive gas, Gets full quickly at meals, Indigestion, Nausea, Rectal Pain and Vomiting. Male Genitourinary Not Present- Blood in Urine, Change in Urinary Stream, Frequency, Impotence, Nocturia, Painful Urination, Urgency and Urine Leakage. Musculoskeletal Present- Joint Stiffness. Not Present- Back Pain, Joint Pain, Muscle Pain, Muscle Weakness and Swelling of Extremities. Neurological Not Present- Decreased Memory, Fainting, Headaches, Numbness, Seizures, Tingling, Tremor, Trouble walking and Weakness. Endocrine Not Present- Cold Intolerance, Excessive Hunger, Hair Changes, Heat Intolerance, Hot flashes and New Diabetes. Hematology Not Present- Blood Thinners, Easy Bruising, Excessive bleeding, Gland problems, HIV and Persistent Infections. All other systems negative  Vitals (Diane Herrin RN; 10/18/2020 2:31 PM) 10/18/2020 2:30 PM Weight: 191.13 lb Height: 72in Body Surface Area: 2.09 m Body Mass Index: 25.92 kg/m  Temp.: 29F  Pulse: 77 (Regular)  P.OX: 99% (Room air) BP: 142/70(Sitting, Left Arm, Standard)       Physical Exam Ralene Ok MD; 10/18/2020 2:50 PM) The physical exam findings are as follows: Note: Constitutional: No acute distress, conversant, appears stated age  Eyes: Anicteric sclerae, moist conjunctiva, no lid lag  Neck: No thyromegaly, trachea  midline, no cervical lymphadenopathy  Lungs: Clear to auscultation biilaterally, normal respiratory effot  Cardiovascular: regular rate & rhythm, no murmurs, no peripheal edema, pedal pulses 2+  GI: Soft, no masses or hepatosplenomegaly, non-tender to palpation  MSK: Normal gait, no clubbing cyanosis, edema  Skin: No rashes, palpation reveals normal skin turgor  Psychiatric: Appropriate judgment and insight, oriented to person, place, and time  Abdomen Inspection Hernias - Inguinal hernia - Left - Reducible - Left (large) .    Assessment & Plan Ralene Ok MD; 10/18/2020 2:51 PM) LEFT INGUINAL HERNIA (K40.90) Impression: 70 year old male with a large left inguinal hernia  1. The patient will like to proceed to the operating room for robotic left inguinal hernia repair with mesh.  2. I discussed with the patient the signs and symptoms of incarceration and strangulation and the need to proceed to the ER should they occur.  3. I discussed with the patient the risks and benefits of the procedure to include but not limited to: Infection, bleeding, damage to surrounding structures, possible need for further surgery, possible nerve pain, and possible recurrence. The patient was understanding and wishes to proceed.

## 2020-10-25 ENCOUNTER — Ambulatory Visit: Payer: Medicare Other | Admitting: Family Medicine

## 2021-03-09 ENCOUNTER — Other Ambulatory Visit: Payer: Self-pay

## 2021-03-09 ENCOUNTER — Encounter (HOSPITAL_COMMUNITY): Payer: Self-pay | Admitting: General Surgery

## 2021-03-09 ENCOUNTER — Ambulatory Visit: Payer: Self-pay | Admitting: General Surgery

## 2021-03-09 NOTE — Progress Notes (Addendum)
Mr. Knope denies chest pain or shortness of shortness of breath. Patient denies having any s/s of Covid in his household.  Patient denies any known exposure to Covid.   I instructed Mr. Amenta to hold vitamins and herbal products.  I instructed Mr. Lamica to shower with antibiotic soap, if it is available.  Dry off with a clean towel. Do not put lotion, powder, cologne or deodorant or makeup.No jewelry or piercings. Men may shave their face and neck. . Wear clean clothes, brush your teeth. Glasses, contact lens,dentures or partials may not be worn in the OR. If you need to wear them, please bring a case for glasses, do not wear contacts or bring a case, the hospital does not have contact cases, dentures or partials will have to be removed , make sure they are clean, we will provide a denture cup to put them in. You will need some one to drive you home and a responsible person over the age of 77 to stay with you for the first 24 hours after surgery.

## 2021-03-13 ENCOUNTER — Encounter (HOSPITAL_COMMUNITY)
Admission: RE | Disposition: A | Discharge status: Home / Self Care | Payer: Self-pay | Source: Home / Self Care | Attending: General Surgery

## 2021-03-13 ENCOUNTER — Other Ambulatory Visit: Payer: Self-pay

## 2021-03-13 ENCOUNTER — Ambulatory Visit (HOSPITAL_COMMUNITY)
Admission: RE | Admit: 2021-03-13 | Discharge: 2021-03-13 | Disposition: A | Discharge status: Home / Self Care | Payer: Medicare Other | Attending: General Surgery | Admitting: General Surgery

## 2021-03-13 ENCOUNTER — Ambulatory Visit (HOSPITAL_COMMUNITY): Payer: Medicare Other | Admitting: Certified Registered Nurse Anesthetist

## 2021-03-13 ENCOUNTER — Encounter (HOSPITAL_COMMUNITY): Payer: Self-pay | Admitting: General Surgery

## 2021-03-13 DIAGNOSIS — Z803 Family history of malignant neoplasm of breast: Secondary | ICD-10-CM | POA: Insufficient documentation

## 2021-03-13 DIAGNOSIS — Z79899 Other long term (current) drug therapy: Secondary | ICD-10-CM | POA: Insufficient documentation

## 2021-03-13 DIAGNOSIS — Z8042 Family history of malignant neoplasm of prostate: Secondary | ICD-10-CM | POA: Insufficient documentation

## 2021-03-13 DIAGNOSIS — K4090 Unilateral inguinal hernia, without obstruction or gangrene, not specified as recurrent: Secondary | ICD-10-CM | POA: Insufficient documentation

## 2021-03-13 HISTORY — DX: Hyperlipidemia, unspecified: E78.5

## 2021-03-13 HISTORY — PX: XI ROBOTIC ASSISTED INGUINAL HERNIA REPAIR WITH MESH: SHX6706

## 2021-03-13 HISTORY — DX: Unspecified osteoarthritis, unspecified site: M19.90

## 2021-03-13 HISTORY — PX: INSERTION OF MESH: SHX5868

## 2021-03-13 HISTORY — DX: Pneumonia, unspecified organism: J18.9

## 2021-03-13 HISTORY — DX: Malignant (primary) neoplasm, unspecified: C80.1

## 2021-03-13 LAB — POCT I-STAT, CHEM 8
BUN: 15 mg/dL (ref 8–23)
Calcium, Ion: 1.22 mmol/L (ref 1.15–1.40)
Chloride: 104 mmol/L (ref 98–111)
Creatinine, Ser: 0.9 mg/dL (ref 0.61–1.24)
Glucose, Bld: 118 mg/dL — ABNORMAL HIGH (ref 70–99)
HCT: 50 % (ref 39.0–52.0)
Hemoglobin: 17 g/dL (ref 13.0–17.0)
Potassium: 4.2 mmol/L (ref 3.5–5.1)
Sodium: 140 mmol/L (ref 135–145)
TCO2: 26 mmol/L (ref 22–32)

## 2021-03-13 LAB — COMPREHENSIVE METABOLIC PANEL
ALT: 63 U/L — ABNORMAL HIGH (ref 0–44)
AST: 39 U/L (ref 15–41)
Albumin: 3.8 g/dL (ref 3.5–5.0)
Alkaline Phosphatase: 47 U/L (ref 38–126)
Anion gap: 8 (ref 5–15)
BUN: 15 mg/dL (ref 8–23)
CO2: 26 mmol/L (ref 22–32)
Calcium: 9 mg/dL (ref 8.9–10.3)
Chloride: 104 mmol/L (ref 98–111)
Creatinine, Ser: 1.03 mg/dL (ref 0.61–1.24)
GFR, Estimated: >60 mL/min (ref >60)
Glucose, Bld: 114 mg/dL — ABNORMAL HIGH (ref 70–99)
Potassium: 3.9 mmol/L (ref 3.5–5.1)
Sodium: 138 mmol/L (ref 135–145)
Total Bilirubin: 1.2 mg/dL (ref 0.3–1.2)
Total Protein: 6.1 g/dL — ABNORMAL LOW (ref 6.5–8.1)

## 2021-03-13 SURGERY — REPAIR, HERNIA, INGUINAL, ROBOT-ASSISTED, LAPAROSCOPIC, USING MESH
Anesthesia: General | Site: Inguinal | Laterality: Left

## 2021-03-13 MED ORDER — LACTATED RINGERS IV SOLN: INTRAVENOUS | Status: DC

## 2021-03-13 MED ORDER — DEXAMETHASONE SODIUM PHOSPHATE 10 MG/ML IJ SOLN
INTRAMUSCULAR | Status: DC | PRN
  Administered 2021-03-13: 5 mg via INTRAVENOUS

## 2021-03-13 MED ORDER — DEXAMETHASONE SODIUM PHOSPHATE 10 MG/ML IJ SOLN
INTRAMUSCULAR | Status: AC
  Filled 2021-03-13: qty 1

## 2021-03-13 MED ORDER — ROCURONIUM BROMIDE 10 MG/ML (PF) SYRINGE
PREFILLED_SYRINGE | INTRAVENOUS | Status: DC | PRN
  Administered 2021-03-13: 60 mg via INTRAVENOUS
  Administered 2021-03-13: 30 mg via INTRAVENOUS

## 2021-03-13 MED ORDER — EPHEDRINE SULFATE 50 MG/ML IJ SOLN
INTRAMUSCULAR | Status: DC | PRN
  Administered 2021-03-13: 5 mg via INTRAVENOUS

## 2021-03-13 MED ORDER — ROCURONIUM BROMIDE 10 MG/ML (PF) SYRINGE
PREFILLED_SYRINGE | INTRAVENOUS | Status: AC
  Filled 2021-03-13: qty 10

## 2021-03-13 MED ORDER — PHENYLEPHRINE HCL-NACL 10-0.9 MG/250ML-% IV SOLN
INTRAVENOUS | Status: DC | PRN
  Administered 2021-03-13: 40 ug/min via INTRAVENOUS

## 2021-03-13 MED ORDER — SODIUM CHLORIDE 0.9 % IV SOLN
INTRAVENOUS | Status: DC | PRN
  Administered 2021-03-13: 40 mL

## 2021-03-13 MED ORDER — CHLORHEXIDINE GLUCONATE 0.12 % MT SOLN
15.0000 mL | Freq: Once | OROMUCOSAL | Status: AC
  Administered 2021-03-13: 15 mL via OROMUCOSAL
  Filled 2021-03-13: qty 15

## 2021-03-13 MED ORDER — BUPIVACAINE HCL (PF) 0.25 % IJ SOLN
INTRAMUSCULAR | Status: AC
  Filled 2021-03-13: qty 30

## 2021-03-13 MED ORDER — FENTANYL CITRATE (PF) 100 MCG/2ML IJ SOLN: 25.0000 ug | INTRAMUSCULAR | Status: DC | PRN

## 2021-03-13 MED ORDER — TRAMADOL HCL 50 MG PO TABS
50.0000 mg | ORAL_TABLET | Freq: Four times a day (QID) | ORAL | 0 refills | Status: AC | PRN
Start: 2021-03-13 — End: 2022-03-13

## 2021-03-13 MED ORDER — ONDANSETRON HCL 4 MG/2ML IJ SOLN
INTRAMUSCULAR | Status: AC
  Filled 2021-03-13: qty 2

## 2021-03-13 MED ORDER — ONDANSETRON HCL 4 MG/2ML IJ SOLN: 4.0000 mg | Freq: Four times a day (QID) | INTRAMUSCULAR | Status: DC | PRN

## 2021-03-13 MED ORDER — LIDOCAINE 2% (20 MG/ML) 5 ML SYRINGE
INTRAMUSCULAR | Status: DC | PRN
  Administered 2021-03-13: 50 mg via INTRAVENOUS

## 2021-03-13 MED ORDER — ORAL CARE MOUTH RINSE: 15.0000 mL | Freq: Once | OROMUCOSAL | Status: AC

## 2021-03-13 MED ORDER — FENTANYL CITRATE (PF) 250 MCG/5ML IJ SOLN
INTRAMUSCULAR | Status: DC | PRN
  Administered 2021-03-13 (×2): 50 ug via INTRAVENOUS
  Administered 2021-03-13: 100 ug via INTRAVENOUS

## 2021-03-13 MED ORDER — ONDANSETRON HCL 4 MG/2ML IJ SOLN
INTRAMUSCULAR | Status: DC | PRN
  Administered 2021-03-13: 4 mg via INTRAVENOUS

## 2021-03-13 MED ORDER — BUPIVACAINE LIPOSOME 1.3 % IJ SUSP
INTRAMUSCULAR | Status: AC
  Filled 2021-03-13: qty 20

## 2021-03-13 MED ORDER — PHENYLEPHRINE 40 MCG/ML (10ML) SYRINGE FOR IV PUSH (FOR BLOOD PRESSURE SUPPORT)
PREFILLED_SYRINGE | INTRAVENOUS | Status: DC | PRN
  Administered 2021-03-13: 120 ug via INTRAVENOUS

## 2021-03-13 MED ORDER — FENTANYL CITRATE (PF) 250 MCG/5ML IJ SOLN
INTRAMUSCULAR | Status: AC
  Filled 2021-03-13: qty 5

## 2021-03-13 MED ORDER — CEFAZOLIN SODIUM-DEXTROSE 2-4 GM/100ML-% IV SOLN
2.0000 g | INTRAVENOUS | Status: AC
  Administered 2021-03-13: 2 g via INTRAVENOUS
  Filled 2021-03-13: qty 100

## 2021-03-13 MED ORDER — CHLORHEXIDINE GLUCONATE CLOTH 2 % EX PADS: 6.0000 | MEDICATED_PAD | Freq: Once | CUTANEOUS | Status: DC

## 2021-03-13 MED ORDER — MIDAZOLAM HCL 2 MG/2ML IJ SOLN
INTRAMUSCULAR | Status: AC
  Filled 2021-03-13: qty 2

## 2021-03-13 MED ORDER — OXYCODONE HCL 5 MG/5ML PO SOLN: 5.0000 mg | Freq: Once | ORAL | Status: DC | PRN

## 2021-03-13 MED ORDER — LIDOCAINE 2% (20 MG/ML) 5 ML SYRINGE
INTRAMUSCULAR | Status: AC
  Filled 2021-03-13: qty 5

## 2021-03-13 MED ORDER — PROPOFOL 10 MG/ML IV BOLUS
INTRAVENOUS | Status: AC
  Filled 2021-03-13: qty 40

## 2021-03-13 MED ORDER — BUPIVACAINE HCL 0.25 % IJ SOLN
INTRAMUSCULAR | Status: DC | PRN
  Administered 2021-03-13: 4 mL

## 2021-03-13 MED ORDER — STERILE WATER FOR IRRIGATION IR SOLN
Status: DC | PRN
  Administered 2021-03-13: 1000 mL

## 2021-03-13 MED ORDER — MIDAZOLAM HCL 2 MG/2ML IJ SOLN
INTRAMUSCULAR | Status: DC | PRN
  Administered 2021-03-13: 2 mg via INTRAVENOUS

## 2021-03-13 MED ORDER — PROPOFOL 10 MG/ML IV BOLUS
INTRAVENOUS | Status: DC | PRN
  Administered 2021-03-13: 160 mg via INTRAVENOUS

## 2021-03-13 MED ORDER — 0.9 % SODIUM CHLORIDE (POUR BTL) OPTIME
TOPICAL | Status: DC | PRN
  Administered 2021-03-13: 1000 mL

## 2021-03-13 MED ORDER — SUGAMMADEX SODIUM 200 MG/2ML IV SOLN
INTRAVENOUS | Status: DC | PRN
  Administered 2021-03-13: 200 mg via INTRAVENOUS

## 2021-03-13 MED ORDER — ENSURE PRE-SURGERY PO LIQD: 296.0000 mL | Freq: Once | ORAL | Status: DC

## 2021-03-13 MED ORDER — OXYCODONE HCL 5 MG PO TABS
5.0000 mg | ORAL_TABLET | Freq: Once | ORAL | Status: DC | PRN
Start: 2021-03-13 — End: 2021-03-13

## 2021-03-13 MED ORDER — ACETAMINOPHEN 500 MG PO TABS
1000.0000 mg | ORAL_TABLET | ORAL | Status: AC
  Administered 2021-03-13: 1000 mg via ORAL
  Filled 2021-03-13: qty 2

## 2021-03-13 MED FILL — Phenylephrine-NaCl IV Solution 10 MG/250ML-0.9%: INTRAVENOUS | Qty: 250 | Status: AC

## 2021-03-13 SURGICAL SUPPLY — 45 items
CHLORAPREP W/TINT 26 (MISCELLANEOUS) ×2 IMPLANT
COVER MAYO STAND STRL (DRAPES) ×2 IMPLANT
COVER SURGICAL LIGHT HANDLE (MISCELLANEOUS) ×2 IMPLANT
COVER TIP SHEARS 8 DVNC (MISCELLANEOUS) ×1 IMPLANT
COVER TIP SHEARS 8MM DA VINCI (MISCELLANEOUS) ×1
DEFOGGER SCOPE WARMER CLEARIFY (MISCELLANEOUS) ×2 IMPLANT
DERMABOND ADVANCED (GAUZE/BANDAGES/DRESSINGS) ×1
DERMABOND ADVANCED .7 DNX12 (GAUZE/BANDAGES/DRESSINGS) ×1 IMPLANT
DEVICE TROCAR PUNCTURE CLOSURE (ENDOMECHANICALS) ×2 IMPLANT
DRAPE ARM DVNC X/XI (DISPOSABLE) ×4 IMPLANT
DRAPE COLUMN DVNC XI (DISPOSABLE) ×1 IMPLANT
DRAPE CV SPLIT W-CLR ANES SCRN (DRAPES) ×2 IMPLANT
DRAPE DA VINCI XI ARM (DISPOSABLE) ×4
DRAPE DA VINCI XI COLUMN (DISPOSABLE) ×1
DRAPE ORTHO SPLIT 77X108 STRL (DRAPES) ×1
DRAPE SURG ORHT 6 SPLT 77X108 (DRAPES) ×1 IMPLANT
ELECT REM PT RETURN 9FT ADLT (ELECTROSURGICAL) ×2
ELECTRODE REM PT RTRN 9FT ADLT (ELECTROSURGICAL) ×1 IMPLANT
GAUZE 4X4 16PLY ~~LOC~~+RFID DBL (SPONGE) ×2 IMPLANT
GLOVE SURG ENC MOIS LTX SZ7.5 (GLOVE) ×4 IMPLANT
GOWN STRL REUS W/ TWL LRG LVL3 (GOWN DISPOSABLE) ×2 IMPLANT
GOWN STRL REUS W/ TWL XL LVL3 (GOWN DISPOSABLE) ×2 IMPLANT
GOWN STRL REUS W/TWL 2XL LVL3 (GOWN DISPOSABLE) ×2 IMPLANT
GOWN STRL REUS W/TWL LRG LVL3 (GOWN DISPOSABLE) ×2
GOWN STRL REUS W/TWL XL LVL3 (GOWN DISPOSABLE) ×2
KIT BASIN OR (CUSTOM PROCEDURE TRAY) ×2 IMPLANT
KIT TURNOVER KIT B (KITS) ×2 IMPLANT
MARKER SKIN DUAL TIP RULER LAB (MISCELLANEOUS) ×2 IMPLANT
MESH PROGRIP LAP SELF FIXATING (Mesh General) ×1 IMPLANT
MESH PROGRIP LAP SLF FIX 16X12 (Mesh General) ×1 IMPLANT
NEEDLE HYPO 22GX1.5 SAFETY (NEEDLE) ×2 IMPLANT
NEEDLE INSUFFLATION 14GA 120MM (NEEDLE) ×2 IMPLANT
PAD ARMBOARD 7.5X6 YLW CONV (MISCELLANEOUS) ×4 IMPLANT
SEAL CANN UNIV 5-8 DVNC XI (MISCELLANEOUS) ×3 IMPLANT
SEAL XI 5MM-8MM UNIVERSAL (MISCELLANEOUS) ×3
SET TUBE SMOKE EVAC HIGH FLOW (TUBING) ×2 IMPLANT
SPONGE T-LAP 18X18 ~~LOC~~+RFID (SPONGE) ×2 IMPLANT
STOPCOCK 4 WAY LG BORE MALE ST (IV SETS) ×2 IMPLANT
SUT MNCRL AB 4-0 PS2 18 (SUTURE) ×2 IMPLANT
SUT VIC AB 2-0 SH 27 (SUTURE)
SUT VIC AB 2-0 SH 27X BRD (SUTURE) IMPLANT
SUT VLOC 180 2-0 9IN GS21 (SUTURE) ×2 IMPLANT
SYR 30ML SLIP (SYRINGE) ×2 IMPLANT
TOWEL GREEN STERILE FF (TOWEL DISPOSABLE) ×2 IMPLANT
TRAY LAPAROSCOPIC MC (CUSTOM PROCEDURE TRAY) ×2 IMPLANT

## 2021-03-13 NOTE — H&P (Signed)
History of Present IllnessThe patient is a 70 year old male who presents with an inguinal hernia. Referred by: Hulan Saas, M.D. Chief Complaint: Large left inguinal hernia   Patient is a 70 year old male, otherwise healthy comes in with a 2 year history of a left inguinal hernia.  Patient states that it slowly gotten larger over the 2 years.  Patient is active, does go to the gym on a daily basis as well as plays golf.  He does state that he has some discomfort sometimes with bowel movements.  He's had no signs or symptoms of incarceration or strangulation.   He's had no previous abdominal surgery.         Past Surgical History  Tonsillectomy     Diagnostic Studies History  Colonoscopy   1-5 years ago   Allergies  No Known Drug Allergies   [10/18/2020]: Allergies Reconciled     Medication History CoQ10  ('50MG'$  Capsule, Oral) Active. Testosterone Cypionate  ('200MG'$ /ML Solution, Injection) Active. Red Yeast Rice  ('600MG'$  Capsule, Oral) Active. L-Carnitine  ('500MG'$  Capsule, Oral) Active. Medications Reconciled    Social History Alcohol use   Moderate alcohol use. No caffeine use   No drug use   Tobacco use   Never smoker.   Family History  Breast Cancer   Mother. Prostate Cancer   Father.   Other Problems  Arthritis   Hemorrhoids   Hypercholesterolemia   Melanoma         Review of Systems  General Not Present- Appetite Loss, Chills, Fatigue, Fever, Night Sweats, Weight Gain and Weight Loss. Skin Not Present- Change in Wart/Mole, Dryness, Hives, Jaundice, New Lesions, Non-Healing Wounds, Rash and Ulcer. HEENT Present- Ringing in the Ears and Seasonal Allergies. Not Present- Earache, Hearing Loss, Hoarseness, Nose Bleed, Oral Ulcers, Sinus Pain, Sore Throat, Visual Disturbances, Wears glasses/contact lenses and Yellow Eyes. Respiratory Present- Snoring. Not Present- Bloody sputum, Chronic Cough, Difficulty Breathing and Wheezing. Cardiovascular Not Present- Chest Pain,  Difficulty Breathing Lying Down, Leg Cramps, Palpitations, Rapid Heart Rate, Shortness of Breath and Swelling of Extremities. Gastrointestinal Present- Hemorrhoids. Not Present- Abdominal Pain, Bloating, Bloody Stool, Change in Bowel Habits, Chronic diarrhea, Constipation, Difficulty Swallowing, Excessive gas, Gets full quickly at meals, Indigestion, Nausea, Rectal Pain and Vomiting. Male Genitourinary Not Present- Blood in Urine, Change in Urinary Stream, Frequency, Impotence, Nocturia, Painful Urination, Urgency and Urine Leakage. Musculoskeletal Present- Joint Stiffness. Not Present- Back Pain, Joint Pain, Muscle Pain, Muscle Weakness and Swelling of Extremities. Neurological Not Present- Decreased Memory, Fainting, Headaches, Numbness, Seizures, Tingling, Tremor, Trouble walking and Weakness. Endocrine Not Present- Cold Intolerance, Excessive Hunger, Hair Changes, Heat Intolerance, Hot flashes and New Diabetes. Hematology Not Present- Blood Thinners, Easy Bruising, Excessive bleeding, Gland problems, HIV and Persistent Infections. All other systems negative  BP (!) 172/87   Pulse 73   Temp (!) 97.5 F (36.4 C) (Oral)   Resp 17   Ht 6' (1.829 m)   Wt 85.3 kg   SpO2 96%   BMI 25.50 kg/m           Physical Exam  The physical exam findings are as follows: Note:   Constitutional: No acute distress, conversant, appears stated age   Eyes: Anicteric sclerae, moist conjunctiva, no lid lag   Neck: No thyromegaly, trachea midline, no cervical lymphadenopathy   Lungs: Clear to auscultation biilaterally, normal respiratory effot   Cardiovascular: regular rate & rhythm, no murmurs, no peripheal edema, pedal pulses 2+   GI: Soft, no masses or hepatosplenomegaly, non-tender  to palpation   MSK: Normal gait, no clubbing cyanosis, edema   Skin: No rashes, palpation reveals normal skin turgor   Psychiatric: Appropriate judgment and insight, oriented to person, place, and time    Abdomen Inspection Hernias - Inguinal hernia - Left - Reducible - Left  (large) .       Assessment & Plan  LEFT INGUINAL HERNIA (K40.90) Impression: 70 year old male with a large left inguinal hernia   1. The patient will like to proceed to the operating room for robotic left inguinal hernia repair with mesh.   2. I discussed with the patient the signs and symptoms of incarceration and strangulation and the need to proceed to the ER should they occur.   3. I discussed with the patient the risks and benefits of the procedure to include but not limited to: Infection, bleeding, damage to surrounding structures, possible need for further surgery, possible nerve pain, and possible recurrence. The patient was understanding and wishes to proceed.

## 2021-03-13 NOTE — Discharge Instructions (Addendum)
[facility] _______[facility] ? ?INGUINAL HERNIA REPAIR: POST OP INSTRUCTIONS ? ?Always review your discharge instruction sheet given to you by the facility where your surgery was performed. ?IF YOU HAVE DISABILITY OR FAMILY LEAVE FORMS, YOU MUST BRING THEM TO THE OFFICE FOR PROCESSING.   ?DO NOT GIVE THEM TO YOUR DOCTOR. ? ?1. A  prescription for pain medication may be given to you upon discharge.  Take your pain medication as prescribed, if needed.  If narcotic pain medicine is not needed, then you may take acetaminophen (Tylenol) or ibuprofen (Advil) as needed. ?2. Take your usually prescribed medications unless otherwise directed. ?If you need a refill on your pain medication, please contact your pharmacy.  They will contact our office to request authorization. Prescriptions will not be filled after 5 pm or on week-ends. ?3. You should follow a light diet the first 24 hours after arrival home, such as soup and crackers, etc.  Be sure to include lots of fluids daily.  Resume your normal diet the day after surgery. ?4.Most patients will experience some swelling and bruising around the umbilicus or in the groin and scrotum.  Ice packs and reclining will help.  Swelling and bruising can take several days to resolve.  ?6. It is common to experience some constipation if taking pain medication after surgery.  Increasing fluid intake and taking a stool softener (such as Colace) will usually help or prevent this problem from occurring.  A mild laxative (Milk of Magnesia or Miralax) should be taken according to package directions if there are no bowel movements after 48 hours. ?7. Unless discharge instructions indicate otherwise, you may remove your bandages 24-48 hours after surgery, and you may shower at that time.  You may have steri-strips (small skin tapes) in place directly over the incision.  These strips should be left on the skin for 7-10 days.  If your surgeon used skin glue on the incision, you may  shower in 24 hours.  The glue will flake off over the next 2-3 weeks.  Any sutures or staples will be removed at the office during your follow-up visit. ?8. ACTIVITIES:  You may resume regular (light) daily activities beginning the next day--such as daily self-care, walking, climbing stairs--gradually increasing activities as tolerated.  You may have sexual intercourse when it is comfortable.  Refrain from any heavy lifting or straining until approved by your doctor. ? ?a.You may drive when you are no longer taking prescription pain medication, you can comfortably wear a seatbelt, and you can safely maneuver your car and apply brakes. ?b.RETURN TO WORK:   ?_____________________________________________ ? ?9.You should see your doctor in the office for a follow-up appointment approximately 2-3 weeks after your surgery.  Make sure that you call for this appointment within a day or two after you arrive home to insure a convenient appointment time. ?10.OTHER INSTRUCTIONS: _________________________ ?   _____________________________________ ? ?WHEN TO CALL YOUR DOCTOR: ?Fever over 101.0 ?Inability to urinate ?Nausea and/or vomiting ?Extreme swelling or bruising ?Continued bleeding from incision. ?Increased pain, redness, or drainage from the incision ? ?The clinic staff is available to answer your questions during regular business hours.  Please don?t hesitate to call and ask to speak to one of the nurses for clinical concerns.  If you have a medical emergency, go to the nearest emergency room or call 911.  A surgeon from [facility] is always on call at the hospital ? ? ?[address], [address], [place], [place]    [address] ? ? [address] [address], [place], [place]   [address] ?[phone] ? [phone] ? FAX [phone] ?Web site: [email or web address] ? ?

## 2021-03-13 NOTE — Anesthesia Preprocedure Evaluation (Signed)
Anesthesia Evaluation  Patient identified by MRN, date of birth, ID band Patient awake    Reviewed: Allergy & Precautions, H&P , NPO status , Patient's Chart, lab work & pertinent test results  Airway Mallampati: II   Neck ROM: full    Dental   Pulmonary    breath sounds clear to auscultation       Cardiovascular negative cardio ROS   Rhythm:regular Rate:Normal     Neuro/Psych    GI/Hepatic   Endo/Other    Renal/GU      Musculoskeletal  (+) Arthritis ,   Abdominal   Peds  Hematology   Anesthesia Other Findings   Reproductive/Obstetrics                             Anesthesia Physical Anesthesia Plan  ASA: 2  Anesthesia Plan: General   Post-op Pain Management:    Induction: Intravenous  PONV Risk Score and Plan: 2 and Ondansetron, Dexamethasone, Midazolam and Treatment may vary due to age or medical condition  Airway Management Planned: Oral ETT  Additional Equipment:   Intra-op Plan:   Post-operative Plan: Extubation in OR  Informed Consent: I have reviewed the patients History and Physical, chart, labs and discussed the procedure including the risks, benefits and alternatives for the proposed anesthesia with the patient or authorized representative who has indicated his/her understanding and acceptance.     Dental advisory given  Plan Discussed with: CRNA, Anesthesiologist and Surgeon  Anesthesia Plan Comments:         Anesthesia Quick Evaluation

## 2021-03-13 NOTE — Anesthesia Procedure Notes (Signed)
Procedure Name: Intubation Date/Time: 03/13/2021 7:43 AM Performed by: Leonor Liv, CRNA Pre-anesthesia Checklist: Patient identified, Emergency Drugs available, Suction available and Patient being monitored Patient Re-evaluated:Patient Re-evaluated prior to induction Oxygen Delivery Method: Circle System Utilized Preoxygenation: Pre-oxygenation with 100% oxygen Induction Type: IV induction Ventilation: Mask ventilation without difficulty Laryngoscope Size: Mac and 4 Grade View: Grade I Tube type: Oral Tube size: 7.5 mm Number of attempts: 1 Airway Equipment and Method: Stylet and Oral airway Placement Confirmation: ETT inserted through vocal cords under direct vision, positive ETCO2 and breath sounds checked- equal and bilateral Secured at: 23 cm Tube secured with: Tape Dental Injury: Teeth and Oropharynx as per pre-operative assessment  Comments: Inserted by Paulina Fusi, SRNA

## 2021-03-13 NOTE — Transfer of Care (Signed)
Immediate Anesthesia Transfer of Care Note  Patient: Daniel [name]  Procedure(s) Performed: XI ROBOTIC ASSISTED LEFT INGUINAL HERNIA REPAIR WITH MESH (Left: Inguinal) INSERTION OF MESH (Left: Inguinal)  Patient Location: PACU  Anesthesia Type:General  Level of Consciousness: awake and alert   Airway & Oxygen Therapy: Patient Spontanous Breathing and Patient connected to face mask oxygen  Post-op Assessment: Report given to RN and Post -op Vital signs reviewed and stable  Post vital signs: Reviewed and stable  Last Vitals:  Vitals Value Taken Time  BP 118/80 03/13/21 0910  Temp 36.8 C 03/13/21 0910  Pulse 74 03/13/21 0912  Resp 25 03/13/21 0912  SpO2 95 % 03/13/21 0912  Vitals shown include unvalidated device data.  Last Pain:  Vitals:   03/13/21 0910  TempSrc:   PainSc: Asleep         Complications: No notable events documented.

## 2021-03-13 NOTE — Op Note (Signed)
03/13/2021  8:58 AM  PATIENT:  Lars Pinks  70 y.o. male  PRE-OPERATIVE DIAGNOSIS:  LEFT INGUINAL HERNIA  POST-OPERATIVE DIAGNOSIS:  LEFT INGUINAL HERNIA  PROCEDURE:  Procedure(s): XI ROBOTIC ASSISTED LEFT INGUINAL HERNIA REPAIR WITH MESH (Left) INSERTION OF MESH (Left)  SURGEON:  Surgeon(s) and Role:    Ralene Ok, MD - Primary  ANESTHESIA:   local and general  EBL:  minimal   BLOOD ADMINISTERED:none  DRAINS: none   LOCAL MEDICATIONS USED:  BUPIVICAINE   SPECIMEN:  No Specimen  DISPOSITION OF SPECIMEN:  PATHOLOGY  COUNTS:  YES  TOURNIQUET:  * No tourniquets in log *  DICTATION: .Dragon Dictation Details of the procedure:   The patient was taken back to the operating room. The patient was placed in supine position with bilateral SCDs in place.  A Foley catheter was placed.  The patient was prepped and draped in the usual sterile fashion.  After appropriate anitbiotics were confirmed, a time-out was confirmed and all facts were verified.  At this time a Veress needle technique was used to inspect the abdomen approximately 10 cm from the valgus and the paramedian line. This time a 8 mm robotic trocar was placed into the abdomen. The camera was placed there was no injury to any intra-abdominal organs. A 103m umbilical port was placed just superior to the umbilicus. An 8 mm port was placed approximately 10 cm lateral to the umbilicus on the left paramedian side.   Robot was positioned over the patient and the ports were docked in the usual fashion.  At this time the left-sided peritoneum was taken down from the medial umbilical ligament laterally. The pre-peritoneal space was entered. Dissection was taken down to Cooper's ligament. At this time it was apparent there was a large indirect hernia. At this time proceeded clean out the rest Cooper's ligament and the medial to lateral direction. I proceeded laterally to dissect the spermatic cord and hernia sac. The spermatic  cord was circumferentially dissected away from the surrounding musculature and tissue. The vas deferens was identified and protected all portions of the case. There was a large indirect hernia.  This was dissected away from the cord. This was dissected back. At this time I proceeded to create a pocket laterally for the mesh. Once the pocket was created the peritoneum was stripped back to approximately the base of the cord. At this time the piece of 16x12cm Progrip mesh was in placed into the dissected area. This covered both the direct and indirect spaces appropriately. This also covered  The femoral space. At this time a 2 V- lock stitch was used to close the peritoneum in a standard running fashion.  At this time the robot was undocked. The umbilical port site was reapproximated using a 0 Vicryl via an Endo Close device 1. All ports were removed. The skin was reapproximated and all port sites using 4-0 Monocryl subcuticular fashion.  The patient the procedure well was taken to the recovery     PLAN OF CARE: Discharge to home after PACU  PATIENT DISPOSITION:  PACU - hemodynamically stable.   Delay start of Pharmacological VTE agent (>24hrs) due to surgical blood loss or risk of bleeding: not applicable

## 2021-03-14 ENCOUNTER — Encounter (HOSPITAL_COMMUNITY): Payer: Self-pay | Admitting: General Surgery

## 2021-03-14 NOTE — Anesthesia Postprocedure Evaluation (Signed)
Anesthesia Post Note  Patient: Daniel [name]  Procedure(s) Performed: XI ROBOTIC ASSISTED LEFT INGUINAL HERNIA REPAIR WITH MESH (Left: Inguinal) INSERTION OF MESH (Left: Inguinal)     Patient location during evaluation: PACU Anesthesia Type: General Level of consciousness: awake and alert Pain management: pain level controlled Vital Signs Assessment: post-procedure vital signs reviewed and stable Respiratory status: spontaneous breathing, nonlabored ventilation, respiratory function stable and patient connected to nasal cannula oxygen Cardiovascular status: blood pressure returned to baseline and stable Postop Assessment: no apparent nausea or vomiting Anesthetic complications: no   No notable events documented.  Last Vitals:  Vitals:   03/13/21 0925 03/13/21 0940  BP: 113/82 128/68  Pulse: 68 69  Resp: 20 19  Temp:  (!) 36.3 C  SpO2: 95% 95%    Last Pain:  Vitals:   03/13/21 0940  TempSrc:   PainSc: 0-No pain                 Kyri Shader S

## 2021-04-29 ENCOUNTER — Encounter: Payer: Self-pay | Admitting: Family Medicine

## 2021-07-12 ENCOUNTER — Other Ambulatory Visit (HOSPITAL_COMMUNITY): Payer: Self-pay | Admitting: Internal Medicine

## 2021-07-31 IMAGING — DX DG LUMBAR SPINE 2-3V
3 series · 3 of 3 positions shown · non-contrast
Comparison: None.

CLINICAL DATA: Back pain, sciatica

EXAM:
LUMBAR SPINE - 2-3 VIEW

[l-spine ap]
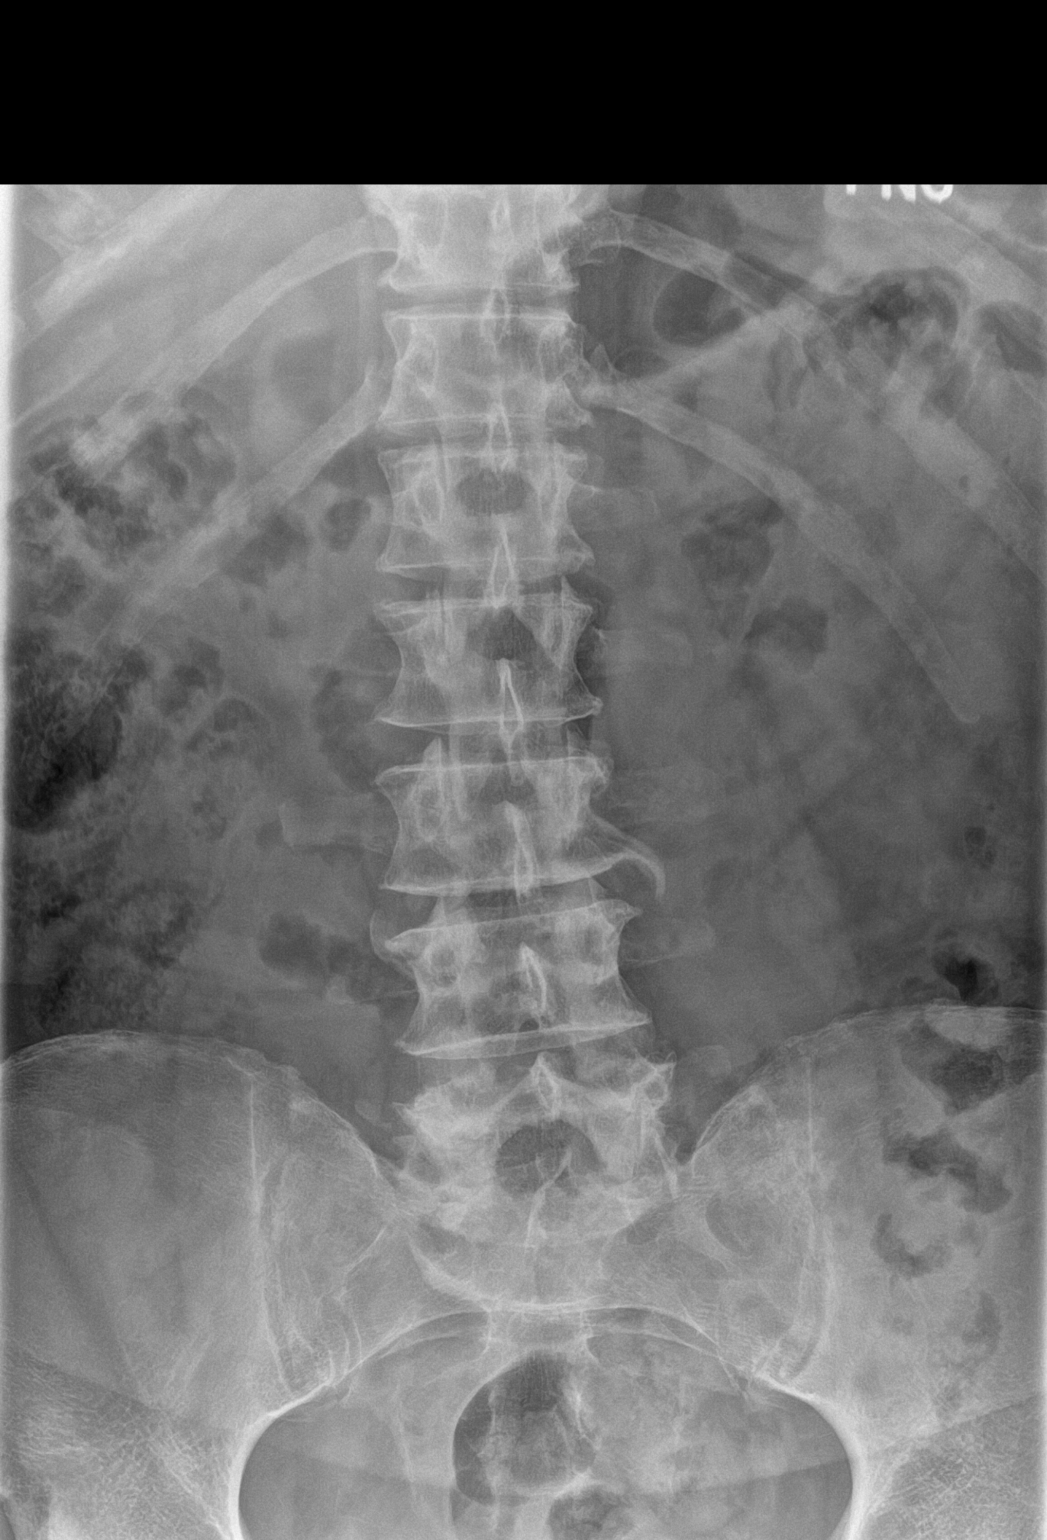

[l-spine lateral (1 of 2)]
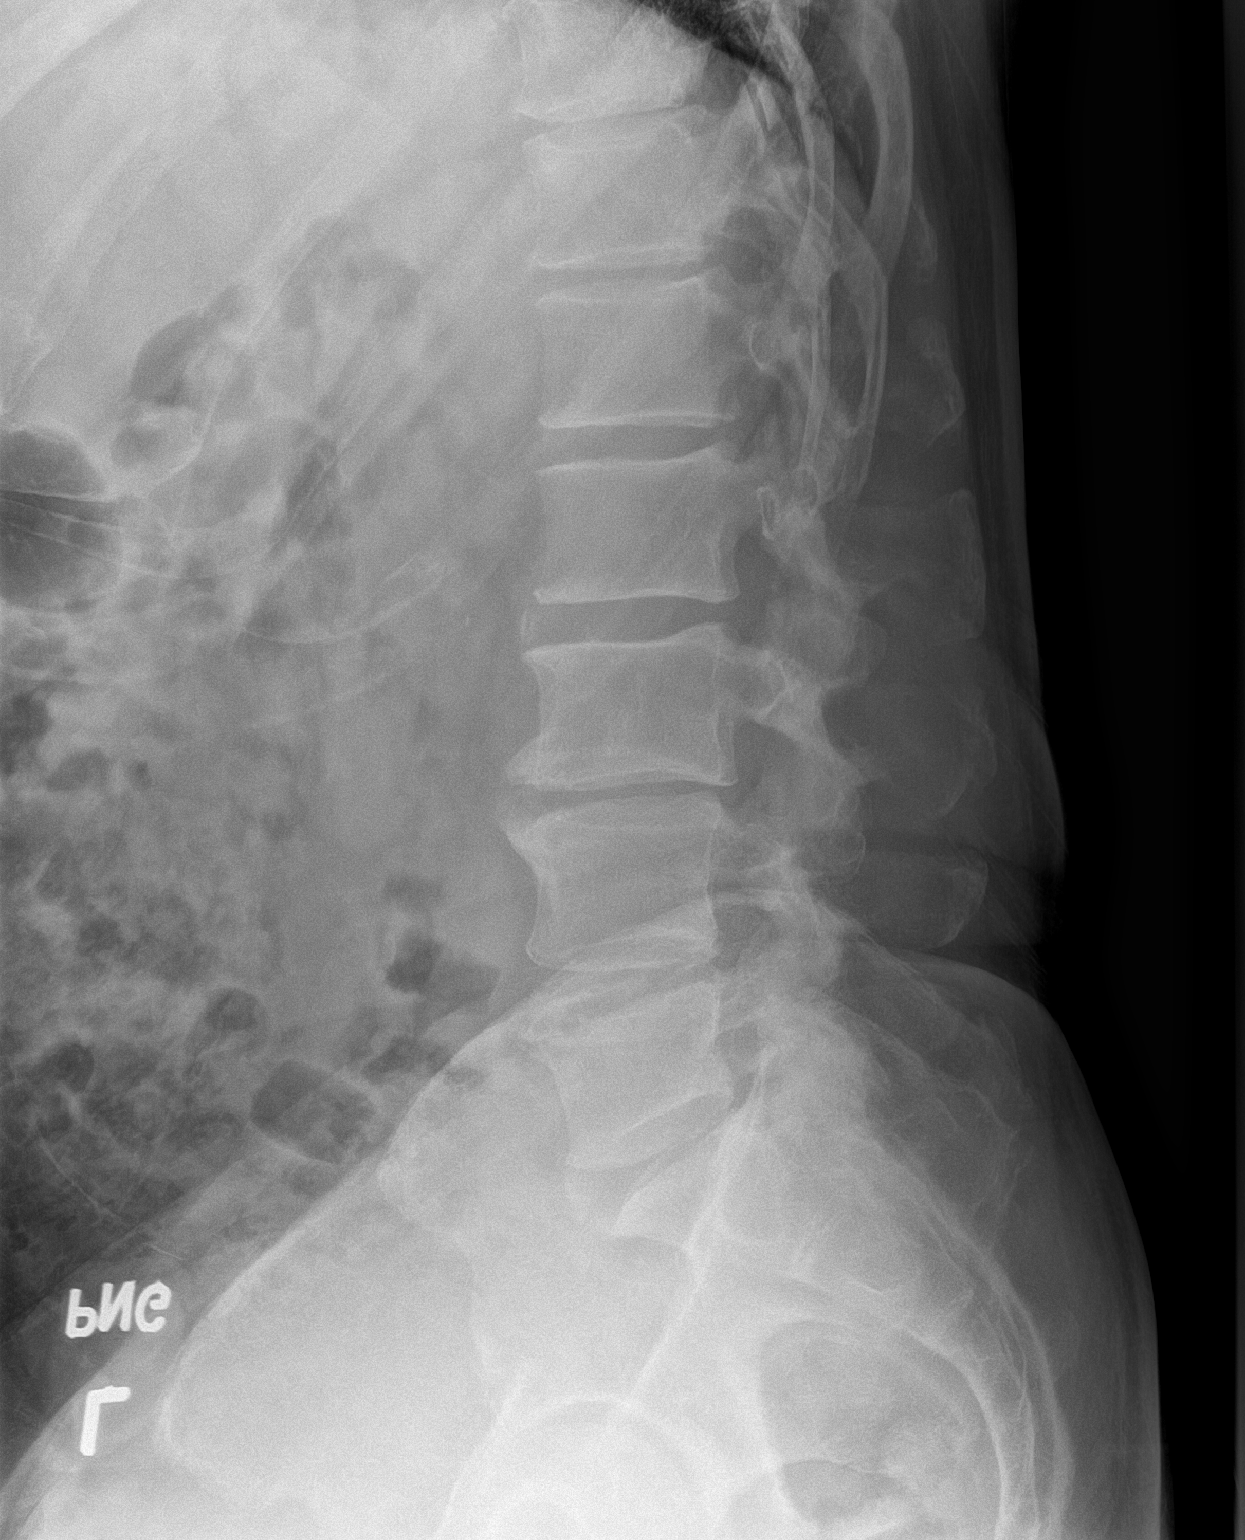

[l-spine lateral (2 of 2)]
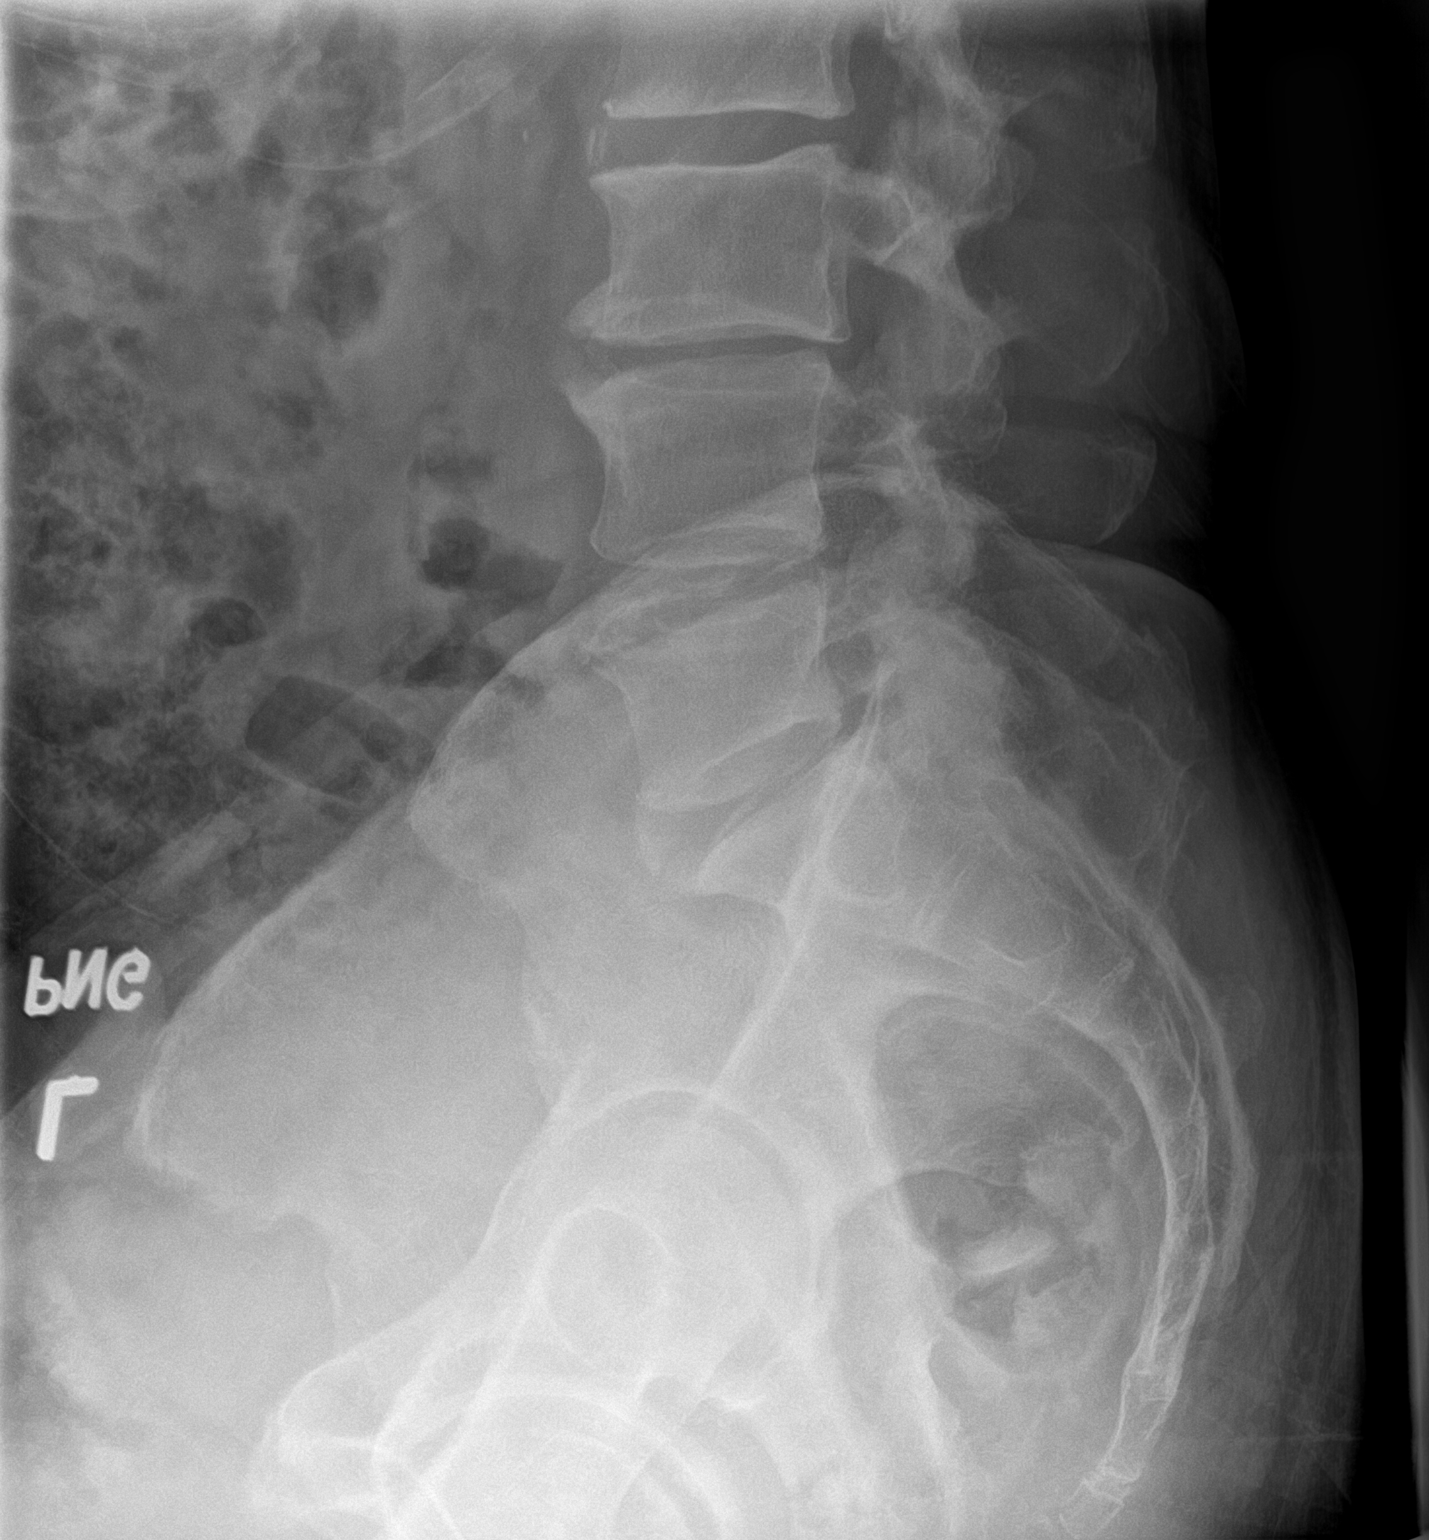

[3 of 3 positions shown; findings below may reference images not displayed]

FINDINGS: Five non rib bearing segments of the lumbar spine. Normal lumbar
lordosis. No acute fracture or listhesis of the lumbar spine.
Vertebral body height is preserved. There is intervertebral disc
space narrowing and endplate remodeling at L3-4 and, to a lesser
extent, L4-5 in keeping with changes of mild to moderate
degenerative disc disease. Degenerative annular calcifications are
noted at L2-3 and L5-S1. The paraspinal soft tissues are
unremarkable.
IMPRESSION: Mild to moderate degenerative disc disease. No acute fracture or
listhesis.

## 2021-08-02 ENCOUNTER — Ambulatory Visit: Payer: Medicare Other

## 2021-10-10 ENCOUNTER — Encounter (HOSPITAL_COMMUNITY): Payer: Self-pay

## 2023-07-15 ENCOUNTER — Encounter: Payer: Self-pay | Admitting: Internal Medicine

## 2023-08-14 NOTE — Progress Notes (Signed)
 [name] [address] [place] [address] Phone: [phone] Subjective:   Daniel [name] Daniel [name], am serving as a scribe for Dr. Arthea [name].  I'm seeing this patient by the request  of:  Daniel Iha, [name]  CC: Left toe pain  YEP:Daniel [name]  10/03/2020 Patient states has had this for some time but has grown recently and becoming minorly symptomatic.  Given different signs and symptoms and when to seek medical attention below we will refer to [name] surgery to discuss potential surgical intervention   Seems to be more piriformis but patient does have some tightness of the lumbar spine.  We will get x-rays to further evaluate.  Discussed home exercises and icing regimen.  Patient still has intermittent radicular symptoms but seems to be worse with sitting that goes against of the lumbar aspect and more of the piriformis.  Patient does respond fairly well to OMT and will follow up again in 6 weeks     Updated 08/19/2023 Daniel [name] is a 73 y.o. male coming in with complaint of toe pain. Right big toe that started hurting and not sure why. Toe pain is worse when moving it. Patient states that he is also having issues with his right sided sciatica. Not sure if manipulation would help or not.        Past Medical History:  Diagnosis Date   Arthritis    Cancer (HCC)    skin, basal and squamous   Hyperlipemia    Pneumonia    Past Surgical History:  Procedure Laterality Date   INSERTION OF MESH Left 03/13/2021   Procedure: INSERTION OF MESH;  Surgeon: Daniel [name];  Location: [name] OR;  Service: General;  Laterality: Left;   TONSILLECTOMY     XI ROBOTIC ASSISTED INGUINAL HERNIA REPAIR WITH MESH Left 03/13/2021   Procedure: XI ROBOTIC ASSISTED LEFT INGUINAL HERNIA REPAIR WITH MESH;  Surgeon: Daniel [name];  Location: [name] OR;  Service: General;  Laterality: Left;   Social History   Socioeconomic History   Marital status: Married     Spouse name: Not on file   Number of children: Not on file   Years of education: Not on file   Highest education level: Not on file  Occupational History   Not on file  Tobacco Use   Smoking status: Never   Smokeless tobacco: Never  Vaping Use   Vaping status: Never Used  Substance and Sexual Activity   Alcohol use: Yes    Alcohol/week: 14.0 standard drinks of alcohol    Types: 14 Glasses of wine per week   Drug use: Not on file   Sexual activity: Not on file  Other Topics Concern   Not on file  Social History Narrative   Not on file   Social Drivers of Health   Financial Resource Strain: Not on file  Food Insecurity: Not on file  Transportation Needs: Not on file  Physical Activity: Not on file  Stress: Not on file  Social Connections: Not on file   Allergies  Allergen Reactions   Shellfish Allergy Anaphylaxis, Swelling and Rash    Throat closes   No family history on file.  Current Outpatient Medications (Endocrine & Metabolic):    testosterone cypionate (DEPOTESTOSTERONE CYPIONATE) 200 MG/ML injection, Inject 80 mg into the muscle every Tuesday.    Current Outpatient Medications (Analgesics):    ibuprofen (ADVIL) 200 MG tablet, Take 400 mg by mouth every 8 (eight) hours as needed (  soreness for golfing).  Current Outpatient Medications (Hematological):    cyanocobalamin (,VITAMIN B-12,) 1000 MCG/ML injection, Inject 1,000 mcg into the muscle every Tuesday.  Current Outpatient Medications (Other):    ALPRAZolam (XANAX) 0.5 MG tablet, Take 0.5 mg by mouth See admin instructions. Take 1 tablet (0.5 mg) by mouth if needed for flight when needed   Cholecalciferol (VITAMIN D-3) 125 MCG (5000 UT) TABS, Take 5,000 Units by mouth in the morning and at bedtime.   econazole nitrate 1 % cream, Apply 1 application topically 2 (two) times daily as needed (rash/ringworm).   gabapentin  (NEURONTIN ) 100 MG capsule, Take 2 capsules (200 mg total) by mouth at bedtime.    Multiple Vitamin (MULTIVITAMIN WITH MINERALS) TABS tablet, Take 1 tablet by mouth in the morning.   Red Yeast Rice Extract (RED YEAST RICE PO), Take by mouth.   vitamin C (ASCORBIC ACID) 500 MG tablet, Take 500 mg by mouth in the morning and at bedtime.   Reviewed prior external information including notes and imaging from  primary care provider As well as notes that were available from care everywhere and other healthcare systems.  Past medical history, social, surgical and family history all reviewed in electronic medical record.  No pertanent information unless stated regarding to the chief complaint.   Review of Systems:  No headache, visual changes, nausea, vomiting, diarrhea, constipation, dizziness, abdominal pain, skin rash, fevers, chills, night sweats, weight loss, swollen lymph nodes, body aches, joint swelling, chest pain, shortness of breath, mood changes. POSITIVE muscle aches  Objective  Blood pressure 120/82, pulse 90, height 6' (1.829 m), weight 189 lb (85.7 kg), SpO2 95%.   General: No apparent distress alert and oriented x3 mood and affect normal, dressed appropriately.  HEENT: Pupils equal, extraocular movements intact  Respiratory: Patient's speak in full sentences and does not appear short of breath  Cardiovascular: No lower extremity edema, non tender, no erythema  Foot exam shows ttp over the 1st mtp right   Limited muscular skeletal ultrasound was performed and interpreted by Daniel [name], M  Right 1st mtp hypoechoic changes noted no arthritis.  Impression: 1st MTP capsulitis   Osteopathic findings T6extended rotated and side bent left L2 flexed rotated and side bent right L4 F RS left  Sacrum right on right    Impression and Recommendations:     The above documentation has been reviewed and is accurate and complete Daniel [name] M Daniel Gerber, DO

## 2023-08-19 ENCOUNTER — Ambulatory Visit (INDEPENDENT_AMBULATORY_CARE_PROVIDER_SITE_OTHER): Payer: Medicare Other | Admitting: Family Medicine

## 2023-08-19 ENCOUNTER — Other Ambulatory Visit: Payer: Self-pay

## 2023-08-19 ENCOUNTER — Encounter: Payer: Self-pay | Admitting: Family Medicine

## 2023-08-19 VITALS — BP 120/82 | HR 90 | Ht 72.0 in | Wt 189.0 lb

## 2023-08-19 DIAGNOSIS — M545 Low back pain, unspecified: Secondary | ICD-10-CM | POA: Insufficient documentation

## 2023-08-19 DIAGNOSIS — M9902 Segmental and somatic dysfunction of thoracic region: Secondary | ICD-10-CM

## 2023-08-19 DIAGNOSIS — M79674 Pain in right toe(s): Secondary | ICD-10-CM

## 2023-08-19 DIAGNOSIS — M9903 Segmental and somatic dysfunction of lumbar region: Secondary | ICD-10-CM | POA: Diagnosis not present

## 2023-08-19 DIAGNOSIS — M7751 Other enthesopathy of right foot: Secondary | ICD-10-CM | POA: Insufficient documentation

## 2023-08-19 DIAGNOSIS — M9904 Segmental and somatic dysfunction of sacral region: Secondary | ICD-10-CM

## 2023-08-19 DIAGNOSIS — M999 Biomechanical lesion, unspecified: Secondary | ICD-10-CM

## 2023-08-19 MED ORDER — GABAPENTIN 100 MG PO CAPS: 200.0000 mg | ORAL_CAPSULE | Freq: Every day | ORAL | 3 refills | Status: DC

## 2023-08-19 NOTE — Assessment & Plan Note (Signed)
 The low back does have some spinal stenosis noted.  I discussed with patient about repetitive extension and avoiding it.  Will monitor for some radicular symptoms.  Given gabapentin .  Hopefully will be beneficial as well.  Will continue to monitor.  Discussed potentially repeating x-rays.  Worsening pain MRI will be necessary.

## 2023-08-19 NOTE — Assessment & Plan Note (Signed)
 Capsulitis noted.  Discussed some potential injection if worsening symptoms.  Discussed a carbon fiber plate.  Follow-up again in 6 to 8 weeks

## 2023-08-19 NOTE — Assessment & Plan Note (Signed)

## 2023-08-19 NOTE — Patient Instructions (Addendum)
 Good to see you  Spinal stenosis exercises given  Voltaren gel 3 times a day  Gabapentin 200mg   [place] fiber plate in shoe Avoid being bare foot Send me a message if you change your mind about injections Follow up in 6 weeks

## 2023-09-01 ENCOUNTER — Ambulatory Visit (INDEPENDENT_AMBULATORY_CARE_PROVIDER_SITE_OTHER): Payer: Medicare Other | Admitting: Sports Medicine

## 2023-09-01 VITALS — HR 90 | Ht 72.0 in | Wt 189.0 lb

## 2023-09-01 DIAGNOSIS — M51362 Other intervertebral disc degeneration, lumbar region with discogenic back pain and lower extremity pain: Secondary | ICD-10-CM | POA: Diagnosis not present

## 2023-09-01 DIAGNOSIS — M545 Low back pain, unspecified: Secondary | ICD-10-CM | POA: Diagnosis not present

## 2023-09-01 DIAGNOSIS — M48062 Spinal stenosis, lumbar region with neurogenic claudication: Secondary | ICD-10-CM | POA: Diagnosis not present

## 2023-09-01 DIAGNOSIS — G8929 Other chronic pain: Secondary | ICD-10-CM

## 2023-09-01 MED ORDER — METHYLPREDNISOLONE ACETATE 80 MG/ML IJ SUSP
80.0000 mg | Freq: Once | INTRAMUSCULAR | Status: AC
  Administered 2023-09-01: 80 mg via INTRAMUSCULAR

## 2023-09-01 MED ORDER — KETOROLAC TROMETHAMINE 60 MG/2ML IM SOLN
60.0000 mg | Freq: Once | INTRAMUSCULAR | Status: AC
  Administered 2023-09-01: 60 mg via INTRAMUSCULAR

## 2023-09-01 NOTE — Progress Notes (Signed)
[name] [address] [place] [address] Phone: [phone]   Assessment and Plan:     1. Chronic bilateral low back pain with right-sided sciatica 2. Spinal stenosis, lumbar region, with neurogenic claudication 3. Degeneration of intervertebral disc of lumbar region with discogenic back pain and lower extremity pain  -Chronic with exacerbation, subsequent sports medicine visit - Most consistent with flare of lumbar degenerative changes leading to right-sided radicular symptoms.  Likely flared due to compensation with patient treating capsulitis of right toe over the past 3 to 4 weeks - Due to recurrent pain flares occurring over a >6-week period, pain worse than 6/10, pain affecting day-to-day activities, degenerative changes seen on x-Daniel [name], I recommend further evaluation with lumbar MRI.  Order placed today.  Patient may use previously prescribed Xanax for anxiety related to MRI claustrophobia - Continue HEP - Recommend relative rest until MRI completed, though May continue physical activity as tolerated - Patient elected for IM injection of methylprednisone 80 mg/Toradol 60 mg.  Injection given in clinic today and tolerated well.   Pertinent previous records reviewed include lumbar x-Daniel [name] 09/13/2020  Follow Up: 5 days after MRI to review results and discuss treatment plan   Subjective:   I, Daniel [name], am serving as a Neurosurgeon for Doctor Daniel [name]  Chief Complaint: back pain   HPI:   10/03/2020 Patient states has had this for some time but has grown recently and becoming minorly symptomatic.  Given different signs and symptoms and when to seek medical attention below we will refer to [facility] surgery to discuss potential surgical intervention   Seems to be more piriformis but patient does have some tightness of the lumbar spine.  We will get x-rays to further evaluate.  Discussed home exercises and icing  regimen.  Patient still has intermittent radicular symptoms but seems to be worse with sitting that goes against of the lumbar aspect and more of the piriformis.  Patient does respond fairly well to OMT and will follow up again in 6 weeks    Updated 08/19/2023 Daniel [name] is a 73 y.o. male coming in with complaint of toe pain. Right big toe that started hurting and not sure why. Toe pain is worse when moving it. Patient states that he is also having issues with his right sided sciatica. Not sure if manipulation would help or not.   09/01/23 Patient states he has been in flare since 08/19/2023. State he has pain going down his right leg from his glute to his right ankle, would like a Toradol injection    Relevant Historical Information: None pertinent  Additional pertinent review of systems negative.   Current Outpatient Medications:    ALPRAZolam (XANAX) 0.5 MG tablet, Take 0.5 mg by mouth See admin instructions. Take 1 tablet (0.5 mg) by mouth if needed for flight when needed, Disp: , Rfl:    Cholecalciferol (VITAMIN D-3) 125 MCG (5000 UT) TABS, Take 5,000 Units by mouth in the morning and at bedtime., Disp: , Rfl:    cyanocobalamin (,VITAMIN B-12,) 1000 MCG/ML injection, Inject 1,000 mcg into the muscle every Tuesday., Disp: , Rfl:    econazole nitrate 1 % cream, Apply 1 application topically 2 (two) times daily as needed (rash/ringworm)., Disp: , Rfl:    gabapentin (NEURONTIN) 100 MG capsule, Take 2 capsules (200 mg total) by mouth at bedtime., Disp: 30 capsule, Rfl: 3   ibuprofen (ADVIL) 200 MG tablet, Take 400 mg by mouth  every 8 (eight) hours as needed (soreness for golfing)., Disp: , Rfl:    Multiple Vitamin (MULTIVITAMIN WITH MINERALS) TABS tablet, Take 1 tablet by mouth in the morning., Disp: , Rfl:    Red Yeast Rice Extract (RED YEAST RICE PO), Take by mouth., Disp: , Rfl:    testosterone cypionate (DEPOTESTOSTERONE CYPIONATE) 200 MG/ML injection, Inject 80 mg into the muscle every  Tuesday., Disp: , Rfl:    vitamin C (ASCORBIC ACID) 500 MG tablet, Take 500 mg by mouth in the morning and at bedtime., Disp: , Rfl:    Objective:     Vitals:   09/01/23 1014  Pulse: 90  SpO2: 98%  Weight: 189 lb (85.7 kg)  Height: 6' (1.829 m)      Body mass index is 25.63 kg/m.    Physical Exam:    Gen: Appears well, nad, nontoxic and pleasant Psych: Alert and oriented, appropriate mood and affect Neuro: sensation intact, strength is 5/5 in upper and lower extremities, muscle tone wnl Skin: no susupicious lesions or rashes  Back - Normal skin, Spine with normal alignment and no deformity.   No tenderness to vertebral process palpation.   Lumbar paraspinous muscles are   tender and without spasm NTTP gluteal musculature Straight leg raise positive right Trendelenberg positive right Piriformis Test negative for radicular symptoms Gait antalgic, favoring left leg, using crutches for support   Electronically signed by:  [name] 12:13 PM 09/01/23

## 2023-09-01 NOTE — Patient Instructions (Signed)
MRI referral  Continue HEP  Follow up 5 days after MRI

## 2023-09-02 ENCOUNTER — Encounter: Payer: Self-pay | Admitting: Family Medicine

## 2023-09-02 ENCOUNTER — Other Ambulatory Visit: Payer: Self-pay | Admitting: Internal Medicine

## 2023-09-02 DIAGNOSIS — E782 Mixed hyperlipidemia: Secondary | ICD-10-CM

## 2023-09-04 ENCOUNTER — Encounter: Payer: Self-pay | Admitting: Family Medicine

## 2023-09-08 ENCOUNTER — Ambulatory Visit (INDEPENDENT_AMBULATORY_CARE_PROVIDER_SITE_OTHER): Payer: Medicare Other

## 2023-09-08 DIAGNOSIS — G8929 Other chronic pain: Secondary | ICD-10-CM

## 2023-09-08 DIAGNOSIS — M48062 Spinal stenosis, lumbar region with neurogenic claudication: Secondary | ICD-10-CM

## 2023-09-08 DIAGNOSIS — M51362 Other intervertebral disc degeneration, lumbar region with discogenic back pain and lower extremity pain: Secondary | ICD-10-CM

## 2023-09-08 MED ORDER — KETOROLAC TROMETHAMINE 60 MG/2ML IM SOLN
60.0000 mg | Freq: Once | INTRAMUSCULAR | Status: AC
  Administered 2023-09-08: 60 mg via INTRAMUSCULAR

## 2023-09-08 MED ORDER — METHYLPREDNISOLONE ACETATE 80 MG/ML IJ SUSP
80.0000 mg | Freq: Once | INTRAMUSCULAR | Status: AC
  Administered 2023-09-08: 80 mg via INTRAMUSCULAR

## 2023-09-08 NOTE — Progress Notes (Signed)
Pt received Ketorolac 60mg / 2mL, 2mL, IM, left upper outer quadrant, tolerated well  Pt received Depo-Medrol, 80 mg/mL, IM, right upper outer quadrant, tolerated well.

## 2023-09-13 ENCOUNTER — Ambulatory Visit
Admission: RE | Admit: 2023-09-13 | Discharge: 2023-09-13 | Disposition: A | Discharge status: Home / Self Care | Payer: Medicare Other | Source: Ambulatory Visit | Attending: Sports Medicine

## 2023-09-13 DIAGNOSIS — G8929 Other chronic pain: Secondary | ICD-10-CM

## 2023-09-17 ENCOUNTER — Encounter: Payer: Self-pay | Admitting: Family Medicine

## 2023-09-18 ENCOUNTER — Other Ambulatory Visit: Payer: Self-pay

## 2023-09-18 DIAGNOSIS — M5416 Radiculopathy, lumbar region: Secondary | ICD-10-CM

## 2023-09-24 ENCOUNTER — Other Ambulatory Visit: Payer: Self-pay

## 2023-09-24 ENCOUNTER — Other Ambulatory Visit: Payer: Medicare Other

## 2023-09-24 DIAGNOSIS — M545 Low back pain, unspecified: Secondary | ICD-10-CM

## 2023-09-24 NOTE — Discharge Instructions (Signed)

## 2023-09-25 ENCOUNTER — Ambulatory Visit
Admission: RE | Admit: 2023-09-25 | Discharge: 2023-09-25 | Disposition: A | Discharge status: Home / Self Care | Payer: Medicare Other | Source: Ambulatory Visit | Attending: Family Medicine | Admitting: Family Medicine

## 2023-09-25 DIAGNOSIS — M5416 Radiculopathy, lumbar region: Secondary | ICD-10-CM

## 2023-09-25 MED ORDER — METHYLPREDNISOLONE ACETATE 40 MG/ML INJ SUSP (RADIOLOG
80.0000 mg | Freq: Once | INTRAMUSCULAR | Status: AC
  Administered 2023-09-25: 80 mg via EPIDURAL

## 2023-09-25 MED ORDER — IOPAMIDOL (ISOVUE-M 200) INJECTION 41%
1.0000 mL | Freq: Once | INTRAMUSCULAR | Status: AC
  Administered 2023-09-25: 1 mL via EPIDURAL

## 2023-09-29 ENCOUNTER — Encounter: Payer: Self-pay | Admitting: Family Medicine

## 2023-09-30 ENCOUNTER — Ambulatory Visit: Payer: Medicare Other | Admitting: Family Medicine

## 2023-10-02 ENCOUNTER — Ambulatory Visit: Payer: Medicare Other

## 2023-10-08 ENCOUNTER — Ambulatory Visit
Admission: RE | Admit: 2023-10-08 | Discharge: 2023-10-08 | Disposition: A | Discharge status: Home / Self Care | Payer: Self-pay | Source: Ambulatory Visit | Attending: Internal Medicine | Admitting: Internal Medicine

## 2023-10-08 DIAGNOSIS — E782 Mixed hyperlipidemia: Secondary | ICD-10-CM | POA: Insufficient documentation

## 2023-10-09 ENCOUNTER — Other Ambulatory Visit: Payer: Self-pay

## 2023-10-09 ENCOUNTER — Ambulatory Visit: Payer: Medicare Other | Attending: Family Medicine

## 2023-10-09 DIAGNOSIS — M545 Low back pain, unspecified: Secondary | ICD-10-CM | POA: Diagnosis not present

## 2023-10-09 DIAGNOSIS — M79604 Pain in right leg: Secondary | ICD-10-CM | POA: Insufficient documentation

## 2023-10-09 DIAGNOSIS — M5416 Radiculopathy, lumbar region: Secondary | ICD-10-CM | POA: Insufficient documentation

## 2023-10-09 NOTE — Therapy (Signed)
 OUTPATIENT PHYSICAL THERAPY THORACOLUMBAR EVALUATION   Patient Name: Daniel [name] MRN: [id number] DOB:[birth year], 73 y.o., male Today's Date: 10/09/2023  END OF SESSION:  Visit Number 1 Number of Visits 13 Date for PT re-eval 11/20/2023  Authorization Type MCR/BCBS  PT start time 1002 PT stop time 1042 PT time calculation (min) 40 min  Behavior During Therapy: [facility] for tasks assessed/performed    Past Medical History:  Diagnosis Date   Arthritis    Cancer (HCC)    skin, basal and squamous   Hyperlipemia    Pneumonia    Past Surgical History:  Procedure Laterality Date   INSERTION OF MESH Left 03/13/2021   Procedure: INSERTION OF MESH;  Surgeon: Axel Filler, MD;  Location: [facility] OR;  Service: General;  Laterality: Left;   TONSILLECTOMY     XI ROBOTIC ASSISTED INGUINAL HERNIA REPAIR WITH MESH Left 03/13/2021   Procedure: XI ROBOTIC ASSISTED LEFT INGUINAL HERNIA REPAIR WITH MESH;  Surgeon: Axel Filler, MD;  Location: [facility] OR;  Service: General;  Laterality: Left;   Patient Active Problem List   Diagnosis Date Noted   Low back pain 08/19/2023   Capsulitis of metatarsophalangeal (MTP) joint of right foot 08/19/2023   Inguinal hernia 09/13/2020   Nonallopathic lesion of lumbar region 05/15/2020   Piriformis syndrome of right side 02/08/2020   Right calf pain 01/04/2020   Right lateral epicondylitis 04/17/2019   Patellar contusion 12/29/2018    PCP: Andi Devon, MD  REFERRING PROVIDER: Judi Saa, DO  REFERRING DIAG:  M54.50 (ICD-10-CM) - Acute bilateral low back pain without sciatica     Rationale for Evaluation and Treatment: Rehabilitation  THERAPY DIAG:  Radiculopathy, lumbar region  Pain in right leg  ONSET DATE: 09/01/2023   SUBJECTIVE:                                                                                                                                                                                           SUBJECTIVE  STATEMENT: Patient reports to PT with R LE pain that began about 1 month ago without known injury. He received epidural injection on 09/01/23. He states that he doesn't experience significant LBP. However, he does have R LE pain with heavy lifting, prolonged standing/walking, and participation in golf.   PERTINENT HISTORY:  Relevant PMHx includes arthritis, Inguinal hernia repair 03/2021  PAIN:  Are you having pain?  Yes: NPRS scale: 4/10 since epidural Pain location: lateral R LE (glutes to ankle) Pain description: radicular, sharp (prior to epidural), dull (since injection)   Aggravating factors: standing  Relieving factors: epidural injection  PRECAUTIONS: None  RED FLAGS: Bowel or bladder  incontinence: No and Cauda equina syndrome: No   WEIGHT BEARING RESTRICTIONS: No  FALLS:  Has patient fallen in last 6 months? No  LIVING ENVIRONMENT: Lives with: lives with their spouse Lives in: House/apartment Stairs:  Yes with railing  OCCUPATION: retired  PLOF: Independent  PATIENT GOALS: Would like to return to PLOF with pain - return to walking, golfing, gym with personal trainer 3x/week   NEXT MD VISIT: plans to f/u 6 weeks after epidural injection  OBJECTIVE:  Note: Objective measures were completed at Evaluation unless otherwise noted.  DIAGNOSTIC FINDINGS:  09/17/2023 MRI Lumbar Spine   IMPRESSION 1. Moderate subarticular narrowing at L4-5 is worse on the right, likely impacting the L5 nerve roots. 2. 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 3. Moderate right subarticular narrowing and mild right foraminal stenosis at L5-S1, likely impacting the right S1 nerve roots. 4. Edematous type 1 Modic changes along the superior endplate of L5 on the right, suggesting more acute changes.  PATIENT SURVEYS:  Modified Oswestry 18/50 = 36%   COGNITION: Overall cognitive status: Within functional limits for tasks  assessed     SENSATION: Not tested   POSTURE: decreased lumbar lordosis  LUMBAR ROM:   AROM eval  Flexion 75%  Extension 20%  Right lateral flexion 50%  Left lateral flexion 50%  Right rotation   Left rotation    (Blank rows = not tested)  LOWER EXTREMITY ROM:   Hamstring:  limited moderately BIL, impacting SLR test and slump test as noted below   Active  Right eval Left eval  Hip flexion    Hip extension    Hip abduction    Hip adduction    Hip internal rotation    Hip external rotation    Knee flexion    Knee extension    Ankle dorsiflexion    Ankle plantarflexion    Ankle inversion    Ankle eversion     (Blank rows = not tested)  LOWER EXTREMITY MMT:    MMT Right eval Left eval  Hip flexion 5 4+  Hip extension 4+ 5  Hip abduction 5 4+  Hip adduction 5 5  Hip internal rotation    Hip external rotation    Knee flexion 5 5  Knee extension 5 5  Ankle dorsiflexion    Ankle plantarflexion    Ankle inversion    Ankle eversion     (Blank rows = not tested)  LUMBAR SPECIAL TESTS:  BIL Straight leg raise test: Negative, Slump test: limited by hamstring tightness BIL , and FABER test: Negative  FUNCTIONAL TESTS:  To be assessed at f/u visit  GAIT: Distance walked: 50 feet from lobby to evaluation room  Assistive device utilized: None Level of assistance: Complete Independence Comments: no significant gait deviations noted at time of initial eval   TREATMENT DATE:   [facility] Adult PT Treatment:                                                DATE: 10/09/2023   Initial evaluation: see patient education and home exercise program as noted below      PATIENT EDUCATION:  Education details: reviewed initial home exercise program; discussion of POC, prognosis and goals for skilled PT  Person educated: Patient Education method: Explanation, Facilities manager, and Handouts Education  comprehension: verbalized understanding, returned demonstration, and needs  further education  HOME EXERCISE PROGRAM: Access Code: [email or web address] URL: [email or web address] Date: 10/09/2023 Prepared by: Mauri Reading  Exercises - Seated Calf Stretch with Strap  - 2 x daily - 7 x weekly - 3 sets - 20-30 sec hold - Seated Hamstring Stretch  - 2 x daily - 7 x weekly - 3 sets - 20-30 sec hold - Seated Lumbar Flexion Stretch  - 2 x daily - 7 x weekly - 2 sets - 10 reps  ASSESSMENT:  CLINICAL IMPRESSION: Markian is a 73 y.o. male who was seen today for physical therapy evaluation and treatment for signs and symptoms consistent with Low Back pain and radicular symptoms. He has no more than minimal LBP with activities at this time, and feels more limited by R LE pain. He is demonstrating decreased LS AROM, decreased BIL hip MMT scores, and diminished hamstring flexibility BIL. He has related pain and difficulty with participating  in normal recreational activities, heavy lifting, prolonged standing and walking. He requires skilled PT services at this time to address relevant deficits and improve overall function.      OBJECTIVE IMPAIRMENTS: decreased activity tolerance, decreased mobility, decreased ROM, decreased strength, and pain.   ACTIVITY LIMITATIONS: carrying, lifting, bending, and standing  PARTICIPATION LIMITATIONS: cleaning, community activity, and golfing  PERSONAL FACTORS: Age and 1 comorbidity: arthritis, hx of inguinal hernia repair  are also affecting patient's functional outcome.   REHAB POTENTIAL: Good  CLINICAL DECISION MAKING: Stable/uncomplicated  EVALUATION COMPLEXITY: Low   GOALS: Goals reviewed with patient? No  SHORT TERM GOALS: Target date: 10/30/2023   Patient will be independent with initial home program for lower quarter mobility.  Baseline: provided at eval  Goal status: INITIAL  2.  Patient will demonstrate improved LS AROM by at least 10% in all directions  Baseline: see objective measures  Goal status:  INITIAL   LONG TERM GOALS: Target date: 11/20/2023   Patient will report ability to perform standing and walking activities for at least 30 minutes without exacerbation of symptoms.  Baseline: unable to tolerate standing > 10 min  Goal status: INITIAL  2.  Patient will demonstrate ability to perform floor to waist lifting of at least 25# using appropriate body mechanics and with no more than minimal pain in order to safely perform normal daily/occupational tasks.   Baseline:  Goal status: INITIAL  3.  Patient will report ability to return to golfing with no more than 2-3/10 NPRS in low back and R LE.  Baseline: unable to golf, pain with swinging Goal status: INITIAL  4. Patient will report improved overall functional ability with modified ODI score of 16% or lower Baseline: 36% Goal status: INITIAL   PLAN:  PT FREQUENCY: 1-2x/week  PT DURATION: 6 weeks  PLANNED INTERVENTIONS: 97164- PT Re-evaluation, 97110-Therapeutic exercises, 97530- Therapeutic activity, 97112- Neuromuscular re-education, 97535- Self Care, 54098- Manual therapy, [id number]- Electrical stimulation (unattended), Patient/Family education, Taping, Dry Needling, Spinal mobilization, Cryotherapy, and Moist heat.  PLAN FOR NEXT SESSION: address LQ mobility, core stabilization progression with bending, reaching, rotation as tolerated   Mauri Reading, PT, DPT  10/12/2023 2:24 PM

## 2023-10-14 ENCOUNTER — Ambulatory Visit: Payer: Medicare Other | Attending: Family Medicine

## 2023-10-14 DIAGNOSIS — M5416 Radiculopathy, lumbar region: Secondary | ICD-10-CM

## 2023-10-14 DIAGNOSIS — M79604 Pain in right leg: Secondary | ICD-10-CM | POA: Diagnosis present

## 2023-10-14 NOTE — Therapy (Signed)
 OUTPATIENT PHYSICAL THERAPY TREATMENT NOTE   Patient Name: Daniel [name] MRN: [id number] DOB:02-15-1951, 73 y.o., male Today's Date: 10/14/2023  END OF SESSION:  PT End of Session - 10/14/23 0950     Visit Number 2    Number of Visits 13    Date for PT Re-Evaluation 11/20/23    Authorization Type MCR/BCBS    PT Start Time 1000    PT Stop Time 1040    PT Time Calculation (min) 40 min    Activity Tolerance Patient tolerated treatment well    Behavior During Therapy WFL for tasks assessed/performed             Past Medical History:  Diagnosis Date   Arthritis    Cancer (HCC)    skin, basal and squamous   Hyperlipemia    Pneumonia    Past Surgical History:  Procedure Laterality Date   INSERTION OF MESH Left 03/13/2021   Procedure: INSERTION OF MESH;  Surgeon: Axel Filler, MD;  Location: [facility] OR;  Service: General;  Laterality: Left;   TONSILLECTOMY     XI ROBOTIC ASSISTED INGUINAL HERNIA REPAIR WITH MESH Left 03/13/2021   Procedure: XI ROBOTIC ASSISTED LEFT INGUINAL HERNIA REPAIR WITH MESH;  Surgeon: Axel Filler, MD;  Location: [facility] OR;  Service: General;  Laterality: Left;   Patient Active Problem List   Diagnosis Date Noted   Low back pain 08/19/2023   Capsulitis of metatarsophalangeal (MTP) joint of right foot 08/19/2023   Inguinal hernia 09/13/2020   Nonallopathic lesion of lumbar region 05/15/2020   Piriformis syndrome of right side 02/08/2020   Right calf pain 01/04/2020   Right lateral epicondylitis 04/17/2019   Patellar contusion 12/29/2018    PCP: Andi Devon, MD  REFERRING PROVIDER: Judi Saa, DO  REFERRING DIAG:  M54.50 (ICD-10-CM) - Acute bilateral low back pain without sciatica     Rationale for Evaluation and Treatment: Rehabilitation  THERAPY DIAG:  Radiculopathy, lumbar region  Pain in right leg  ONSET DATE: 09/01/2023   SUBJECTIVE:                                                                                                                                                                                            SUBJECTIVE STATEMENT: Patient reports that his lower back pain is at a 3/10 and continues to have pain that radiates down to his ankle on the RLE.  EVAL: Patient reports to PT with R LE pain that began about 1 month ago without known injury. He received epidural injection on 09/01/23. He states that he doesn't experience significant LBP. However, he does have R LE  pain with heavy lifting, prolonged standing/walking, and participation in golf.   PERTINENT HISTORY:  Relevant PMHx includes arthritis, Inguinal hernia repair 03/2021  PAIN:  Are you having pain?  Yes: NPRS scale: 4/10 since epidural Pain location: lateral R LE (glutes to ankle) Pain description: radicular, sharp (prior to epidural), dull (since injection)   Aggravating factors: standing  Relieving factors: epidural injection  PRECAUTIONS: None  RED FLAGS: Bowel or bladder incontinence: No and Cauda equina syndrome: No   WEIGHT BEARING RESTRICTIONS: No  FALLS:  Has patient fallen in last 6 months? No  LIVING ENVIRONMENT: Lives with: lives with their spouse Lives in: House/apartment Stairs:  Yes with railing  OCCUPATION: retired  PLOF: Independent  PATIENT GOALS: Would like to return to PLOF with pain - return to walking, golfing, gym with personal trainer 3x/week   NEXT MD VISIT: plans to f/u 6 weeks after epidural injection  OBJECTIVE:  Note: Objective measures were completed at Evaluation unless otherwise noted.  DIAGNOSTIC FINDINGS:  09/17/2023 MRI Lumbar Spine   IMPRESSION 1. Moderate subarticular narrowing at L4-5 is worse on the right, likely impacting the L5 nerve roots. 2. 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 3. Moderate right subarticular narrowing and mild right foraminal stenosis at L5-S1, likely impacting the right S1 nerve roots. 4. Edematous type  1 Modic changes along the superior endplate of L5 on the right, suggesting more acute changes.  PATIENT SURVEYS:  Modified Oswestry 18/50 = 36%   COGNITION: Overall cognitive status: Within functional limits for tasks assessed     SENSATION: Not tested   POSTURE: decreased lumbar lordosis  LUMBAR ROM:   AROM eval  Flexion 75%  Extension 20%  Right lateral flexion 50%  Left lateral flexion 50%  Right rotation   Left rotation    (Blank rows = not tested)  LOWER EXTREMITY ROM:   Hamstring:  limited moderately BIL, impacting SLR test and slump test as noted below   Active  Right eval Left eval  Hip flexion    Hip extension    Hip abduction    Hip adduction    Hip internal rotation    Hip external rotation    Knee flexion    Knee extension    Ankle dorsiflexion    Ankle plantarflexion    Ankle inversion    Ankle eversion     (Blank rows = not tested)  LOWER EXTREMITY MMT:    MMT Right eval Left eval  Hip flexion 5 4+  Hip extension 4+ 5  Hip abduction 5 4+  Hip adduction 5 5  Hip internal rotation    Hip external rotation    Knee flexion 5 5  Knee extension 5 5  Ankle dorsiflexion    Ankle plantarflexion    Ankle inversion    Ankle eversion     (Blank rows = not tested)  LUMBAR SPECIAL TESTS:  BIL Straight leg raise test: Negative, Slump test: limited by hamstring tightness BIL , and FABER test: Negative  FUNCTIONAL TESTS:  To be assessed at f/u visit  GAIT: Distance walked: 50 feet from lobby to evaluation room  Assistive device utilized: None Level of assistance: Complete Independence Comments: no significant gait deviations noted at time of initial eval   TREATMENT DATE:  [facility] Adult PT Treatment:  DATE: 10/14/23 Therapeutic Exercise: Nustep level 5 x 5 min Standing hip abduction/extension RTB at ankles 2x10 BIL Seated hamstring stretch 2x30" BIL Bridges with ball 2x10 Supine sciatic nerve  glide RLE x20 Sidelying open book x10 BIL LTR x10 with abd x10 POE x2' Prone press ups 2x10 Seated pball roll outs fwd/lat x10 [facility] Adult PT Treatment:                                                DATE: 10/09/2023   Initial evaluation: see patient education and home exercise program as noted below      PATIENT EDUCATION:  Education details: reviewed initial home exercise program; discussion of POC, prognosis and goals for skilled PT  Person educated: Patient Education method: Explanation, Demonstration, and Handouts Education comprehension: verbalized understanding, returned demonstration, and needs further education  HOME EXERCISE PROGRAM: Access Code: [email or web address] URL: [email or web address] Date: 10/09/2023 Prepared by: Mauri Reading  Exercises - Seated Calf Stretch with Strap  - 2 x daily - 7 x weekly - 3 sets - 20-30 sec hold - Seated Hamstring Stretch  - 2 x daily - 7 x weekly - 3 sets - 20-30 sec hold - Seated Lumbar Flexion Stretch  - 2 x daily - 7 x weekly - 2 sets - 10 reps  ASSESSMENT:  CLINICAL IMPRESSION: Patient presents to first follow up PT session reporting continued pain at a 3/10 that radiates down to the the ankle on the RLE. Session today focused on glute strengthening, lumbar mobility, and stretching to decrease pain and radiating symptoms. Patient was able to tolerate all prescribed exercises with no adverse effects. Patient continues to benefit from skilled PT services and should be progressed as able to improve functional independence.   EVAL: Cyle is a 73 y.o. male who was seen today for physical therapy evaluation and treatment for signs and symptoms consistent with Low Back pain and radicular symptoms. He has no more than minimal LBP with activities at this time, and feels more limited by R LE pain. He is demonstrating decreased LS AROM, decreased BIL hip MMT scores, and diminished hamstring flexibility BIL. He has related pain and  difficulty with participating  in normal recreational activities, heavy lifting, prolonged standing and walking. He requires skilled PT services at this time to address relevant deficits and improve overall function.      OBJECTIVE IMPAIRMENTS: decreased activity tolerance, decreased mobility, decreased ROM, decreased strength, and pain.   ACTIVITY LIMITATIONS: carrying, lifting, bending, and standing  PARTICIPATION LIMITATIONS: cleaning, community activity, and golfing  PERSONAL FACTORS: Age and 1 comorbidity: arthritis, hx of inguinal hernia repair  are also affecting patient's functional outcome.   REHAB POTENTIAL: Good  CLINICAL DECISION MAKING: Stable/uncomplicated  EVALUATION COMPLEXITY: Low   GOALS: Goals reviewed with patient? No  SHORT TERM GOALS: Target date: 10/30/2023   Patient will be independent with initial home program for lower quarter mobility.  Baseline: provided at eval  Goal status: INITIAL  2.  Patient will demonstrate improved LS AROM by at least 10% in all directions  Baseline: see objective measures  Goal status: INITIAL   LONG TERM GOALS: Target date: 11/20/2023   Patient will report ability to perform standing and walking activities for at least 30 minutes without exacerbation of symptoms.  Baseline: unable to tolerate standing > 10 min  Goal status:  INITIAL  2.  Patient will demonstrate ability to perform floor to waist lifting of at least 25# using appropriate body mechanics and with no more than minimal pain in order to safely perform normal daily/occupational tasks.   Baseline:  Goal status: INITIAL  3.  Patient will report ability to return to golfing with no more than 2-3/10 NPRS in low back and R LE.  Baseline: unable to golf, pain with swinging Goal status: INITIAL  4. Patient will report improved overall functional ability with modified ODI score of 16% or lower Baseline: 36% Goal status: INITIAL   PLAN:  PT FREQUENCY:  1-2x/week  PT DURATION: 6 weeks  PLANNED INTERVENTIONS: 97164- PT Re-evaluation, 97110-Therapeutic exercises, 97530- Therapeutic activity, 97112- Neuromuscular re-education, 97535- Self Care, [id number]- Manual therapy, [id number]- Electrical stimulation (unattended), Patient/Family education, Taping, Dry Needling, Spinal mobilization, Cryotherapy, and Moist heat.  PLAN FOR NEXT SESSION: address LQ mobility, core stabilization progression with bending, reaching, rotation as tolerated   Berta Minor PTA  10/14/2023 10:41 AM

## 2023-10-16 ENCOUNTER — Ambulatory Visit: Payer: Medicare Other

## 2023-10-16 DIAGNOSIS — M5416 Radiculopathy, lumbar region: Secondary | ICD-10-CM

## 2023-10-16 DIAGNOSIS — M79604 Pain in right leg: Secondary | ICD-10-CM

## 2023-10-16 NOTE — Therapy (Signed)
 OUTPATIENT PHYSICAL THERAPY TREATMENT NOTE   Patient Name: Daniel [name] MRN: [id number] DOB:1951/02/02, 73 y.o., male Today's Date: 10/16/2023  END OF SESSION:  PT End of Session - 10/16/23 1351     Visit Number 3    Number of Visits 13    Date for PT Re-Evaluation 11/20/23    Authorization Type MCR/BCBS    PT Start Time 1400    PT Stop Time 1440    PT Time Calculation (min) 40 min    Activity Tolerance Patient tolerated treatment well    Behavior During Therapy WFL for tasks assessed/performed             Past Medical History:  Diagnosis Date   Arthritis    Cancer (HCC)    skin, basal and squamous   Hyperlipemia    Pneumonia    Past Surgical History:  Procedure Laterality Date   INSERTION OF MESH Left 03/13/2021   Procedure: INSERTION OF MESH;  Surgeon: Axel Filler, MD;  Location: [facility] OR;  Service: General;  Laterality: Left;   TONSILLECTOMY     XI ROBOTIC ASSISTED INGUINAL HERNIA REPAIR WITH MESH Left 03/13/2021   Procedure: XI ROBOTIC ASSISTED LEFT INGUINAL HERNIA REPAIR WITH MESH;  Surgeon: Axel Filler, MD;  Location: [facility] OR;  Service: General;  Laterality: Left;   Patient Active Problem List   Diagnosis Date Noted   Low back pain 08/19/2023   Capsulitis of metatarsophalangeal (MTP) joint of right foot 08/19/2023   Inguinal hernia 09/13/2020   Nonallopathic lesion of lumbar region 05/15/2020   Piriformis syndrome of right side 02/08/2020   Right calf pain 01/04/2020   Right lateral epicondylitis 04/17/2019   Patellar contusion 12/29/2018    PCP: Andi Devon, MD  REFERRING PROVIDER: Judi Saa, DO  REFERRING DIAG:  M54.50 (ICD-10-CM) - Acute bilateral low back pain without sciatica     Rationale for Evaluation and Treatment: Rehabilitation  THERAPY DIAG:  Radiculopathy, lumbar region  Pain in right leg  ONSET DATE: 09/01/2023   SUBJECTIVE:                                                                                                                                                                                            SUBJECTIVE STATEMENT: Patient reports that his pain is at a 1/10 today and that he did not have any soreness after the last session.   EVAL: Patient reports to PT with R LE pain that began about 1 month ago without known injury. He received epidural injection on 09/01/23. He states that he doesn't experience significant LBP. However, he does have R LE pain with  heavy lifting, prolonged standing/walking, and participation in golf.   PERTINENT HISTORY:  Relevant PMHx includes arthritis, Inguinal hernia repair 03/2021  PAIN:  Are you having pain?  Yes: NPRS scale: 4/10 since epidural Pain location: lateral R LE (glutes to ankle) Pain description: radicular, sharp (prior to epidural), dull (since injection)   Aggravating factors: standing  Relieving factors: epidural injection  PRECAUTIONS: None  RED FLAGS: Bowel or bladder incontinence: No and Cauda equina syndrome: No   WEIGHT BEARING RESTRICTIONS: No  FALLS:  Has patient fallen in last 6 months? No  LIVING ENVIRONMENT: Lives with: lives with their spouse Lives in: House/apartment Stairs:  Yes with railing  OCCUPATION: retired  PLOF: Independent  PATIENT GOALS: Would like to return to PLOF with pain - return to walking, golfing, gym with personal trainer 3x/week   NEXT MD VISIT: plans to f/u 6 weeks after epidural injection  OBJECTIVE:  Note: Objective measures were completed at Evaluation unless otherwise noted.  DIAGNOSTIC FINDINGS:  09/17/2023 MRI Lumbar Spine   IMPRESSION 1. Moderate subarticular narrowing at L4-5 is worse on the right, likely impacting the L5 nerve roots. 2. 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 3. Moderate right subarticular narrowing and mild right foraminal stenosis at L5-S1, likely impacting the right S1 nerve roots. 4. Edematous type 1 Modic  changes along the superior endplate of L5 on the right, suggesting more acute changes.  PATIENT SURVEYS:  Modified Oswestry 18/50 = 36%   COGNITION: Overall cognitive status: Within functional limits for tasks assessed     SENSATION: Not tested   POSTURE: decreased lumbar lordosis  LUMBAR ROM:   AROM eval  Flexion 75%  Extension 20%  Right lateral flexion 50%  Left lateral flexion 50%  Right rotation   Left rotation    (Blank rows = not tested)  LOWER EXTREMITY ROM:   Hamstring:  limited moderately BIL, impacting SLR test and slump test as noted below   Active  Right eval Left eval  Hip flexion    Hip extension    Hip abduction    Hip adduction    Hip internal rotation    Hip external rotation    Knee flexion    Knee extension    Ankle dorsiflexion    Ankle plantarflexion    Ankle inversion    Ankle eversion     (Blank rows = not tested)  LOWER EXTREMITY MMT:    MMT Right eval Left eval  Hip flexion 5 4+  Hip extension 4+ 5  Hip abduction 5 4+  Hip adduction 5 5  Hip internal rotation    Hip external rotation    Knee flexion 5 5  Knee extension 5 5  Ankle dorsiflexion    Ankle plantarflexion    Ankle inversion    Ankle eversion     (Blank rows = not tested)  LUMBAR SPECIAL TESTS:  BIL Straight leg raise test: Negative, Slump test: limited by hamstring tightness BIL , and FABER test: Negative  FUNCTIONAL TESTS:  To be assessed at f/u visit  GAIT: Distance walked: 50 feet from lobby to evaluation room  Assistive device utilized: None Level of assistance: Complete Independence Comments: no significant gait deviations noted at time of initial eval   TREATMENT DATE: [facility] Adult PT Treatment:  DATE: 10/16/23 Therapeutic Exercise: Nustep level 5 x 5 min Standing hip abduction/extension RTB at ankles 2x10 BIL Standing 3 way hip on blue oval foam x10 BIL Sled push/pull 40# 2 laps x20 ft Palloff press  with rotation 13# x10 BIL Seated hamstring stretch 2x30" BIL Bridges on pball 2x10 Supine sciatic nerve glide RLE x20 POE x2' Prone press ups 2x10 Modified thomas stretch EOM x1' BIL    [facility] Adult PT Treatment:                                                DATE: 10/14/23 Therapeutic Exercise: Nustep level 5 x 5 min Standing hip abduction/extension RTB at ankles 2x10 BIL Seated hamstring stretch 2x30" BIL Bridges with ball 2x10 Supine sciatic nerve glide RLE x20 Sidelying open book x10 BIL LTR x10 with abd x10 POE x2' Prone press ups 2x10 Seated pball roll outs fwd/lat x10 ea   [facility] Adult PT Treatment:                                                DATE: 10/09/2023   Initial evaluation: see patient education and home exercise program as noted below     PATIENT EDUCATION:  Education details: reviewed initial home exercise program; discussion of POC, prognosis and goals for skilled PT  Person educated: Patient Education method: Explanation, Demonstration, and Handouts Education comprehension: verbalized understanding, returned demonstration, and needs further education  HOME EXERCISE PROGRAM: Access Code: [email or web address] URL: [email or web address] Date: 10/09/2023 Prepared by: Mauri Reading  Exercises - Seated Calf Stretch with Strap  - 2 x daily - 7 x weekly - 3 sets - 20-30 sec hold - Seated Hamstring Stretch  - 2 x daily - 7 x weekly - 3 sets - 20-30 sec hold - Seated Lumbar Flexion Stretch  - 2 x daily - 7 x weekly - 2 sets - 10 reps  ASSESSMENT:  CLINICAL IMPRESSION: Patient presents to PT reporting 1/10 pain and that he did not have any soreness after the last session. Session today continued to focus on strengthening for glutes, core, and proximal hip as well as stretching and lumbar mobility. Patient was able to tolerate all prescribed exercises with no adverse effects. Patient continues to benefit from skilled PT services and should be progressed as able  to improve functional independence.   EVAL: Soloman is a 73 y.o. male who was seen today for physical therapy evaluation and treatment for signs and symptoms consistent with Low Back pain and radicular symptoms. He has no more than minimal LBP with activities at this time, and feels more limited by R LE pain. He is demonstrating decreased LS AROM, decreased BIL hip MMT scores, and diminished hamstring flexibility BIL. He has related pain and difficulty with participating  in normal recreational activities, heavy lifting, prolonged standing and walking. He requires skilled PT services at this time to address relevant deficits and improve overall function.     OBJECTIVE IMPAIRMENTS: decreased activity tolerance, decreased mobility, decreased ROM, decreased strength, and pain.   ACTIVITY LIMITATIONS: carrying, lifting, bending, and standing  PARTICIPATION LIMITATIONS: cleaning, community activity, and golfing  PERSONAL FACTORS: Age and 1 comorbidity: arthritis, hx of inguinal hernia repair  are also affecting patient's  functional outcome.   REHAB POTENTIAL: Good  CLINICAL DECISION MAKING: Stable/uncomplicated  EVALUATION COMPLEXITY: Low   GOALS: Goals reviewed with patient? No  SHORT TERM GOALS: Target date: 10/30/2023   Patient will be independent with initial home program for lower quarter mobility.  Baseline: provided at eval  Goal status: INITIAL  2.  Patient will demonstrate improved LS AROM by at least 10% in all directions  Baseline: see objective measures  Goal status: INITIAL   LONG TERM GOALS: Target date: 11/20/2023   Patient will report ability to perform standing and walking activities for at least 30 minutes without exacerbation of symptoms.  Baseline: unable to tolerate standing > 10 min  Goal status: INITIAL  2.  Patient will demonstrate ability to perform floor to waist lifting of at least 25# using appropriate body mechanics and with no more than minimal pain in  order to safely perform normal daily/occupational tasks.   Baseline:  Goal status: INITIAL  3.  Patient will report ability to return to golfing with no more than 2-3/10 NPRS in low back and R LE.  Baseline: unable to golf, pain with swinging Goal status: INITIAL  4. Patient will report improved overall functional ability with modified ODI score of 16% or lower Baseline: 36% Goal status: INITIAL   PLAN:  PT FREQUENCY: 1-2x/week  PT DURATION: 6 weeks  PLANNED INTERVENTIONS: 97164- PT Re-evaluation, 97110-Therapeutic exercises, 97530- Therapeutic activity, 97112- Neuromuscular re-education, 97535- Self Care, 21308- Manual therapy, [id number]- Electrical stimulation (unattended), Patient/Family education, Taping, Dry Needling, Spinal mobilization, Cryotherapy, and Moist heat.  PLAN FOR NEXT SESSION: address LQ mobility, core stabilization progression with bending, reaching, rotation as tolerated   Berta Minor PTA  10/16/2023 2:47 PM

## 2023-10-16 NOTE — Progress Notes (Signed)
 [facility] [address] [place] [address] Phone: [phone] Subjective:   Daniel [name], am serving as a scribe for Dr. Antoine Primas.  I'm seeing this patient by the request  of:  Andi Devon, MD  CC: Back pain follow-up  HPI: Daniel [name] is a 73 y.o. male coming in with complaint of back pain. Here to talk about options for pain. Patient states that is doing better. Started PT. Pain will run from 1/10 to 3/10. Would like to discuss to MRI results. Can he start chipping and putting?  Patient's MRI did show some spinal stenosis with some protruding disc and facet arthropathy.  In addition to this patient did have relatively significant spinal stenosis.  The much better after the epidural.       Past Medical History:  Diagnosis Date   Arthritis    Cancer (HCC)    skin, basal and squamous   Hyperlipemia    Pneumonia    Past Surgical History:  Procedure Laterality Date   INSERTION OF MESH Left 03/13/2021   Procedure: INSERTION OF MESH;  Surgeon: Axel Filler, MD;  Location: [facility] OR;  Service: General;  Laterality: Left;   TONSILLECTOMY     XI ROBOTIC ASSISTED INGUINAL HERNIA REPAIR WITH MESH Left 03/13/2021   Procedure: XI ROBOTIC ASSISTED LEFT INGUINAL HERNIA REPAIR WITH MESH;  Surgeon: Axel Filler, MD;  Location: [facility] OR;  Service: General;  Laterality: Left;   Social History   Socioeconomic History   Marital status: Married    Spouse name: Not on file   Number of children: Not on file   Years of education: Not on file   Highest education level: Not on file  Occupational History   Not on file  Tobacco Use   Smoking status: Never   Smokeless tobacco: Never  Vaping Use   Vaping status: Never Used  Substance and Sexual Activity   Alcohol use: Yes    Alcohol/week: 14.0 standard drinks of alcohol    Types: 14 Glasses of wine per week   Drug use: Not on file   Sexual activity: Not on file  Other Topics Concern   Not on  file  Social History Narrative   Not on file   Social Drivers of Health   Financial Resource Strain: Not on file  Food Insecurity: Not on file  Transportation Needs: Not on file  Physical Activity: Not on file  Stress: Not on file  Social Connections: Not on file   Allergies  Allergen Reactions   Shellfish Allergy Anaphylaxis, Swelling and Rash    Throat closes   No family history on file.  Current Outpatient Medications (Endocrine & Metabolic):    testosterone cypionate (DEPOTESTOSTERONE CYPIONATE) 200 MG/ML injection, Inject 80 mg into the muscle every Tuesday.    Current Outpatient Medications (Analgesics):    ibuprofen (ADVIL) 200 MG tablet, Take 400 mg by mouth every 8 (eight) hours as needed (soreness for golfing).  Current Outpatient Medications (Hematological):    cyanocobalamin (,VITAMIN B-12,) 1000 MCG/ML injection, Inject 1,000 mcg into the muscle every Tuesday.  Current Outpatient Medications (Other):    ALPRAZolam (XANAX) 0.5 MG tablet, Take 0.5 mg by mouth See admin instructions. Take 1 tablet (0.5 mg) by mouth if needed for flight when needed   Cholecalciferol (VITAMIN D-3) 125 MCG (5000 UT) TABS, Take 5,000 Units by mouth in the morning and at bedtime.   econazole nitrate 1 % cream, Apply 1 application topically 2 (two)  times daily as needed (rash/ringworm).   gabapentin (NEURONTIN) 100 MG capsule, Take 2 capsules (200 mg total) by mouth at bedtime.   Multiple Vitamin (MULTIVITAMIN WITH MINERALS) TABS tablet, Take 1 tablet by mouth in the morning.   Red Yeast Rice Extract (RED YEAST RICE PO), Take by mouth.   vitamin C (ASCORBIC ACID) 500 MG tablet, Take 500 mg by mouth in the morning and at bedtime.   Reviewed prior external information including notes and imaging from  primary care provider As well as notes that were available from care everywhere and other healthcare systems.  Past medical history, social, surgical and family history all reviewed in  electronic medical record.  No pertanent information unless stated regarding to the chief complaint.   Review of Systems:  No headache, visual changes, nausea, vomiting, diarrhea, constipation, dizziness, abdominal pain, skin rash, fevers, chills, night sweats, weight loss, swollen lymph nodes, body aches, joint swelling, chest pain, shortness of breath, mood changes. POSITIVE muscle aches  Objective  Blood pressure 122/82, pulse 86, height 6' (1.829 m), weight 194 lb (88 kg), SpO2 96%.   General: No apparent distress alert and oriented x3 mood and affect normal, dressed appropriately.  HEENT: Pupils equal, extraocular movements intact  Respiratory: Patient's speak in full sentences and does not appear short of breath  Cardiovascular: No lower extremity edema, non tender, no erythema   Back exam shows the patient does not have any significant loss lordosis.  Easy to go from a seated to standing position today.  Still seems to protective with some extension of the back.  Patient though is moving significantly better and neurovascularly intact distally.    Impression and Recommendations:

## 2023-10-17 ENCOUNTER — Ambulatory Visit: Admitting: Family Medicine

## 2023-10-17 VITALS — BP 122/82 | HR 86 | Ht 72.0 in | Wt 194.0 lb

## 2023-10-17 DIAGNOSIS — M48061 Spinal stenosis, lumbar region without neurogenic claudication: Secondary | ICD-10-CM | POA: Diagnosis not present

## 2023-10-17 DIAGNOSIS — M545 Low back pain, unspecified: Secondary | ICD-10-CM | POA: Diagnosis not present

## 2023-10-17 NOTE — Assessment & Plan Note (Signed)
 Degenerative spinal stenosis.  Discussed icing regimen and home exercises, discussed with patient that I do think he will do well and can start to transition into more aggressive therapy.  Did discuss avoiding significant extension.  Increase activity slowly otherwise.  Follow-up again 2 to 3 months.

## 2023-10-17 NOTE — Patient Instructions (Signed)
 Pitching wedge next week Irons week after that PT order placed Keep appt on 27th

## 2023-10-22 ENCOUNTER — Ambulatory Visit: Payer: Medicare Other

## 2023-10-22 DIAGNOSIS — M5416 Radiculopathy, lumbar region: Secondary | ICD-10-CM

## 2023-10-22 DIAGNOSIS — M79604 Pain in right leg: Secondary | ICD-10-CM

## 2023-10-22 NOTE — Therapy (Signed)
 OUTPATIENT PHYSICAL THERAPY TREATMENT NOTE   Patient Name: Willy Pinkerton MRN: [id number] DOB:May 29, 1951, 73 y.o., male Today's Date: 10/22/2023  END OF SESSION:  PT End of Session - 10/22/23 0956     Visit Number 4    Number of Visits 13    Date for PT Re-Evaluation 11/20/23    Authorization Type MCR/BCBS    PT Start Time 1000    PT Stop Time 1040    PT Time Calculation (min) 40 min    Activity Tolerance Patient tolerated treatment well    Behavior During Therapy WFL for tasks assessed/performed             Past Medical History:  Diagnosis Date   Arthritis    Cancer (HCC)    skin, basal and squamous   Hyperlipemia    Pneumonia    Past Surgical History:  Procedure Laterality Date   INSERTION OF MESH Left 03/13/2021   Procedure: INSERTION OF MESH;  Surgeon: Axel Filler, MD;  Location: [facility] OR;  Service: General;  Laterality: Left;   TONSILLECTOMY     XI ROBOTIC ASSISTED INGUINAL HERNIA REPAIR WITH MESH Left 03/13/2021   Procedure: XI ROBOTIC ASSISTED LEFT INGUINAL HERNIA REPAIR WITH MESH;  Surgeon: Axel Filler, MD;  Location: [facility] OR;  Service: General;  Laterality: Left;   Patient Active Problem List   Diagnosis Date Noted   Degenerative lumbar spinal stenosis 10/17/2023   Low back pain 08/19/2023   Capsulitis of metatarsophalangeal (MTP) joint of right foot 08/19/2023   Inguinal hernia 09/13/2020   Nonallopathic lesion of lumbar region 05/15/2020   Piriformis syndrome of right side 02/08/2020   Right calf pain 01/04/2020   Right lateral epicondylitis 04/17/2019   Patellar contusion 12/29/2018    PCP: Andi Devon, MD  REFERRING PROVIDER: Judi Saa, DO  REFERRING DIAG:  M54.50 (ICD-10-CM) - Acute bilateral low back pain without sciatica     Rationale for Evaluation and Treatment: Rehabilitation  THERAPY DIAG:  Radiculopathy, lumbar region  Pain in right leg  ONSET DATE: 09/01/2023   SUBJECTIVE:                                                                                                                                                                                            SUBJECTIVE STATEMENT: Patient reports that his pain continues to improve, did a lot of standing yesterday with no increase in pain.   EVAL: Patient reports to PT with R LE pain that began about 1 month ago without known injury. He received epidural injection on 09/01/23. He states that he doesn't experience significant LBP. However, he does have  R LE pain with heavy lifting, prolonged standing/walking, and participation in golf.   PERTINENT HISTORY:  Relevant PMHx includes arthritis, Inguinal hernia repair 03/2021  PAIN:  Are you having pain?  Yes: NPRS scale: 4/10 since epidural Pain location: lateral R LE (glutes to ankle) Pain description: radicular, sharp (prior to epidural), dull (since injection)   Aggravating factors: standing  Relieving factors: epidural injection  PRECAUTIONS: None  RED FLAGS: Bowel or bladder incontinence: No and Cauda equina syndrome: No   WEIGHT BEARING RESTRICTIONS: No  FALLS:  Has patient fallen in last 6 months? No  LIVING ENVIRONMENT: Lives with: lives with their spouse Lives in: House/apartment Stairs:  Yes with railing  OCCUPATION: retired  PLOF: Independent  PATIENT GOALS: Would like to return to PLOF with pain - return to walking, golfing, gym with personal trainer 3x/week   NEXT MD VISIT: plans to f/u 6 weeks after epidural injection  OBJECTIVE:  Note: Objective measures were completed at Evaluation unless otherwise noted.  DIAGNOSTIC FINDINGS:  09/17/2023 MRI Lumbar Spine   IMPRESSION 1. Moderate subarticular narrowing at L4-5 is worse on the right, likely impacting the L5 nerve roots. 2. 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 3. Moderate right subarticular narrowing and mild right foraminal stenosis at L5-S1, likely impacting the  right S1 nerve roots. 4. Edematous type 1 Modic changes along the superior endplate of L5 on the right, suggesting more acute changes.  PATIENT SURVEYS:  Modified Oswestry 18/50 = 36%   COGNITION: Overall cognitive status: Within functional limits for tasks assessed     SENSATION: Not tested   POSTURE: decreased lumbar lordosis  LUMBAR ROM:   AROM eval  Flexion 75%  Extension 20%  Right lateral flexion 50%  Left lateral flexion 50%  Right rotation   Left rotation    (Blank rows = not tested)  LOWER EXTREMITY ROM:   Hamstring:  limited moderately BIL, impacting SLR test and slump test as noted below   Active  Right eval Left eval  Hip flexion    Hip extension    Hip abduction    Hip adduction    Hip internal rotation    Hip external rotation    Knee flexion    Knee extension    Ankle dorsiflexion    Ankle plantarflexion    Ankle inversion    Ankle eversion     (Blank rows = not tested)  LOWER EXTREMITY MMT:    MMT Right eval Left eval  Hip flexion 5 4+  Hip extension 4+ 5  Hip abduction 5 4+  Hip adduction 5 5  Hip internal rotation    Hip external rotation    Knee flexion 5 5  Knee extension 5 5  Ankle dorsiflexion    Ankle plantarflexion    Ankle inversion    Ankle eversion     (Blank rows = not tested)  LUMBAR SPECIAL TESTS:  BIL Straight leg raise test: Negative, Slump test: limited by hamstring tightness BIL , and FABER test: Negative  FUNCTIONAL TESTS:  To be assessed at f/u visit  GAIT: Distance walked: 50 feet from lobby to evaluation room  Assistive device utilized: None Level of assistance: Complete Independence Comments: no significant gait deviations noted at time of initial eval   TREATMENT DATE: [facility] Adult PT Treatment:  DATE: 10/22/23 Therapeutic Exercise: Nustep level 6 x 5 min Standing hip abduction/extension RTB at ankles 2x10 BIL Standing 3 way hip on blue oval foam x10  BIL Sled push/pull 40# 2 laps x20 ft Palloff press with rotation 17# x10 BIL Seated hamstring stretch 2x30" BIL Bridges on pball 2x10 Supine sciatic nerve glide RLE x20 POE x2' Prone press ups 2x10 Prone hip extension 2x10 BIL Modified thomas stretch EOM x1' BIL   [facility] Adult PT Treatment:                                                DATE: 10/16/23 Therapeutic Exercise: Nustep level 5 x 5 min Standing hip abduction/extension RTB at ankles 2x10 BIL Standing 3 way hip on blue oval foam x10 BIL Sled push/pull 40# 2 laps x20 ft Palloff press with rotation 13# x10 BIL Seated hamstring stretch 2x30" BIL Bridges on pball 2x10 Supine sciatic nerve glide RLE x20 POE x2' Prone press ups 2x10 Modified thomas stretch EOM x1' BIL    [facility] Adult PT Treatment:                                                DATE: 10/14/23 Therapeutic Exercise: Nustep level 5 x 5 min Standing hip abduction/extension RTB at ankles 2x10 BIL Seated hamstring stretch 2x30" BIL Bridges with ball 2x10 Supine sciatic nerve glide RLE x20 Sidelying open book x10 BIL LTR x10 with abd x10 POE x2' Prone press ups 2x10 Seated pball roll outs fwd/lat x10 ea    PATIENT EDUCATION:  Education details: reviewed initial home exercise program; discussion of POC, prognosis and goals for skilled PT  Person educated: Patient Education method: Explanation, Demonstration, and Handouts Education comprehension: verbalized understanding, returned demonstration, and needs further education  HOME EXERCISE PROGRAM: Access Code: [email or web address] URL: [email or web address] Date: 10/09/2023 Prepared by: Mauri Reading  Exercises - Seated Calf Stretch with Strap  - 2 x daily - 7 x weekly - 3 sets - 20-30 sec hold - Seated Hamstring Stretch  - 2 x daily - 7 x weekly - 3 sets - 20-30 sec hold - Seated Lumbar Flexion Stretch  - 2 x daily - 7 x weekly - 2 sets - 10 reps  ASSESSMENT:  CLINICAL IMPRESSION: Patient presents to  PT reporting up to 2/10 lower back pain and that he was able to stand for a prolonged period yesterday without an increase in pain. Session today continued to focus on core, proximal hip, and glute strengthening as well as stretching for hips. Patient was able to tolerate all prescribed exercises with no adverse effects. Patient continues to benefit from skilled PT services and should be progressed as able to improve functional independence.   EVAL: Helmer is a 73 y.o. male who was seen today for physical therapy evaluation and treatment for signs and symptoms consistent with Low Back pain and radicular symptoms. He has no more than minimal LBP with activities at this time, and feels more limited by R LE pain. He is demonstrating decreased LS AROM, decreased BIL hip MMT scores, and diminished hamstring flexibility BIL. He has related pain and difficulty with participating  in normal recreational activities, heavy lifting, prolonged standing and walking. He requires skilled PT  services at this time to address relevant deficits and improve overall function.     OBJECTIVE IMPAIRMENTS: decreased activity tolerance, decreased mobility, decreased ROM, decreased strength, and pain.   ACTIVITY LIMITATIONS: carrying, lifting, bending, and standing  PARTICIPATION LIMITATIONS: cleaning, community activity, and golfing  PERSONAL FACTORS: Age and 1 comorbidity: arthritis, hx of inguinal hernia repair  are also affecting patient's functional outcome.   REHAB POTENTIAL: Good  CLINICAL DECISION MAKING: Stable/uncomplicated  EVALUATION COMPLEXITY: Low   GOALS: Goals reviewed with patient? No  SHORT TERM GOALS: Target date: 10/30/2023   Patient will be independent with initial home program for lower quarter mobility.  Baseline: provided at eval  Goal status: INITIAL  2.  Patient will demonstrate improved LS AROM by at least 10% in all directions  Baseline: see objective measures  Goal status:  INITIAL   LONG TERM GOALS: Target date: 11/20/2023   Patient will report ability to perform standing and walking activities for at least 30 minutes without exacerbation of symptoms.  Baseline: unable to tolerate standing > 10 min  Goal status: INITIAL  2.  Patient will demonstrate ability to perform floor to waist lifting of at least 25# using appropriate body mechanics and with no more than minimal pain in order to safely perform normal daily/occupational tasks.   Baseline:  Goal status: INITIAL  3.  Patient will report ability to return to golfing with no more than 2-3/10 NPRS in low back and R LE.  Baseline: unable to golf, pain with swinging Goal status: INITIAL  4. Patient will report improved overall functional ability with modified ODI score of 16% or lower Baseline: 36% Goal status: INITIAL   PLAN:  PT FREQUENCY: 1-2x/week  PT DURATION: 6 weeks  PLANNED INTERVENTIONS: 97164- PT Re-evaluation, 97110-Therapeutic exercises, 97530- Therapeutic activity, 97112- Neuromuscular re-education, 97535- Self Care, 16109- Manual therapy, [id number]- Electrical stimulation (unattended), Patient/Family education, Taping, Dry Needling, Spinal mobilization, Cryotherapy, and Moist heat.  PLAN FOR NEXT SESSION: address LQ mobility, core stabilization progression with bending, reaching, rotation as tolerated   Berta Minor PTA  10/22/2023 10:41 AM

## 2023-10-29 ENCOUNTER — Ambulatory Visit: Payer: Medicare Other

## 2023-10-29 DIAGNOSIS — M79604 Pain in right leg: Secondary | ICD-10-CM

## 2023-10-29 DIAGNOSIS — M5416 Radiculopathy, lumbar region: Secondary | ICD-10-CM | POA: Diagnosis not present

## 2023-10-29 NOTE — Therapy (Signed)
 OUTPATIENT PHYSICAL THERAPY TREATMENT NOTE   Patient Name: Daniel [name] MRN: [id number] DOB:08-06-1951, 73 y.o., male Today's Date: 10/29/2023  END OF SESSION:  PT End of Session - 10/29/23 0959     Visit Number 5    Number of Visits 13    Date for PT Re-Evaluation 11/20/23    Authorization Type MCR/BCBS    PT Start Time 1000    PT Stop Time 1040    PT Time Calculation (min) 40 min    Activity Tolerance Patient tolerated treatment well    Behavior During Therapy WFL for tasks assessed/performed             Past Medical History:  Diagnosis Date   Arthritis    Cancer (HCC)    skin, basal and squamous   Hyperlipemia    Pneumonia    Past Surgical History:  Procedure Laterality Date   INSERTION OF MESH Left 03/13/2021   Procedure: INSERTION OF MESH;  Surgeon: Axel Filler, MD;  Location: [facility] OR;  Service: General;  Laterality: Left;   TONSILLECTOMY     XI ROBOTIC ASSISTED INGUINAL HERNIA REPAIR WITH MESH Left 03/13/2021   Procedure: XI ROBOTIC ASSISTED LEFT INGUINAL HERNIA REPAIR WITH MESH;  Surgeon: Axel Filler, MD;  Location: [facility] OR;  Service: General;  Laterality: Left;   Patient Active Problem List   Diagnosis Date Noted   Degenerative lumbar spinal stenosis 10/17/2023   Low back pain 08/19/2023   Capsulitis of metatarsophalangeal (MTP) joint of right foot 08/19/2023   Inguinal hernia 09/13/2020   Nonallopathic lesion of lumbar region 05/15/2020   Piriformis syndrome of right side 02/08/2020   Right calf pain 01/04/2020   Right lateral epicondylitis 04/17/2019   Patellar contusion 12/29/2018    PCP: Andi Devon, MD  REFERRING PROVIDER: Judi Saa, DO  REFERRING DIAG:  M54.50 (ICD-10-CM) - Acute bilateral low back pain without sciatica     Rationale for Evaluation and Treatment: Rehabilitation  THERAPY DIAG:  Radiculopathy, lumbar region  Pain in right leg  ONSET DATE: 09/01/2023   SUBJECTIVE:                                                                                                                                                                                            SUBJECTIVE STATEMENT: Patient reports that he had some pain last night, up to a 6/10 that went away after about 15 minutes. He reports 0-1/10 pain this morning.   EVAL: Patient reports to PT with R LE pain that began about 1 month ago without known injury. He received epidural injection on 09/01/23. He states that he doesn't  experience significant LBP. However, he does have R LE pain with heavy lifting, prolonged standing/walking, and participation in golf.   PERTINENT HISTORY:  Relevant PMHx includes arthritis, Inguinal hernia repair 03/2021  PAIN:  Are you having pain?  Yes: NPRS scale: 4/10 since epidural Pain location: lateral R LE (glutes to ankle) Pain description: radicular, sharp (prior to epidural), dull (since injection)   Aggravating factors: standing  Relieving factors: epidural injection  PRECAUTIONS: None  RED FLAGS: Bowel or bladder incontinence: No and Cauda equina syndrome: No   WEIGHT BEARING RESTRICTIONS: No  FALLS:  Has patient fallen in last 6 months? No  LIVING ENVIRONMENT: Lives with: lives with their spouse Lives in: House/apartment Stairs:  Yes with railing  OCCUPATION: retired  PLOF: Independent  PATIENT GOALS: Would like to return to PLOF with pain - return to walking, golfing, gym with personal trainer 3x/week   NEXT MD VISIT: plans to f/u 6 weeks after epidural injection  OBJECTIVE:  Note: Objective measures were completed at Evaluation unless otherwise noted.  DIAGNOSTIC FINDINGS:  09/17/2023 MRI Lumbar Spine   IMPRESSION 1. Moderate subarticular narrowing at L4-5 is worse on the right, likely impacting the L5 nerve roots. 2. 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 3. Moderate right subarticular narrowing and mild right foraminal stenosis at  L5-S1, likely impacting the right S1 nerve roots. 4. Edematous type 1 Modic changes along the superior endplate of L5 on the right, suggesting more acute changes.  PATIENT SURVEYS:  Modified Oswestry 18/50 = 36%   COGNITION: Overall cognitive status: Within functional limits for tasks assessed     SENSATION: Not tested   POSTURE: decreased lumbar lordosis  LUMBAR ROM:   AROM eval  Flexion 75%  Extension 20%  Right lateral flexion 50%  Left lateral flexion 50%  Right rotation   Left rotation    (Blank rows = not tested)  LOWER EXTREMITY ROM:   Hamstring:  limited moderately BIL, impacting SLR test and slump test as noted below   Active  Right eval Left eval  Hip flexion    Hip extension    Hip abduction    Hip adduction    Hip internal rotation    Hip external rotation    Knee flexion    Knee extension    Ankle dorsiflexion    Ankle plantarflexion    Ankle inversion    Ankle eversion     (Blank rows = not tested)  LOWER EXTREMITY MMT:    MMT Right eval Left eval  Hip flexion 5 4+  Hip extension 4+ 5  Hip abduction 5 4+  Hip adduction 5 5  Hip internal rotation    Hip external rotation    Knee flexion 5 5  Knee extension 5 5  Ankle dorsiflexion    Ankle plantarflexion    Ankle inversion    Ankle eversion     (Blank rows = not tested)  LUMBAR SPECIAL TESTS:  BIL Straight leg raise test: Negative, Slump test: limited by hamstring tightness BIL , and FABER test: Negative  FUNCTIONAL TESTS:  To be assessed at f/u visit  GAIT: Distance walked: 50 feet from lobby to evaluation room  Assistive device utilized: None Level of assistance: Complete Independence Comments: no significant gait deviations noted at time of initial eval   TREATMENT DATE: [facility] Adult PT Treatment:  DATE: 10/29/23 Therapeutic Exercise: Nustep level 6 x 5 min Standing hip abduction/extension RTB at ankles 2x10 BIL Sled push/pull  50# 2 laps x20 ft Shoulder extension @FM  with bar 23# 2x10 - cues for TA activation Palloff press with rotation 17# x10 BIL Seated hamstring stretch 2x30" BIL Bridges on pball 2x10 LTR on pball x10 BIL POE x2' Prone press ups x10 Prone hip extension x10 BIL   [facility] Adult PT Treatment:                                                DATE: 10/22/23 Therapeutic Exercise: Nustep level 6 x 5 min Standing hip abduction/extension RTB at ankles 2x10 BIL Standing 3 way hip on blue oval foam x10 BIL Sled push/pull 40# 2 laps x20 ft Palloff press with rotation 17# x10 BIL Seated hamstring stretch 2x30" BIL Bridges on pball 2x10 Supine sciatic nerve glide RLE x20 POE x2' Prone press ups 2x10 Prone hip extension 2x10 BIL Modified thomas stretch EOM x1' BIL   [facility] Adult PT Treatment:                                                DATE: 10/16/23 Therapeutic Exercise: Nustep level 5 x 5 min Standing hip abduction/extension RTB at ankles 2x10 BIL Standing 3 way hip on blue oval foam x10 BIL Sled push/pull 40# 2 laps x20 ft Palloff press with rotation 13# x10 BIL Seated hamstring stretch 2x30" BIL Bridges on pball 2x10 Supine sciatic nerve glide RLE x20 POE x2' Prone press ups 2x10 Modified thomas stretch EOM x1' BIL    PATIENT EDUCATION:  Education details: reviewed initial home exercise program; discussion of POC, prognosis and goals for skilled PT  Person educated: Patient Education method: Explanation, Demonstration, and Handouts Education comprehension: verbalized understanding, returned demonstration, and needs further education  HOME EXERCISE PROGRAM: Access Code: [email or web address] URL: [email or web address] Date: 10/09/2023 Prepared by: Mauri Reading  Exercises - Seated Calf Stretch with Strap  - 2 x daily - 7 x weekly - 3 sets - 20-30 sec hold - Seated Hamstring Stretch  - 2 x daily - 7 x weekly - 3 sets - 20-30 sec hold - Seated Lumbar Flexion Stretch  - 2 x daily -  7 x weekly - 2 sets - 10 reps  ASSESSMENT:  CLINICAL IMPRESSION: Patient presents to PT reporting 0-1/10 pain in his lower back and that he had 6/10 pain that night that was transient. Session today continued to focus on core, proximal hip, and glute strengthening. He continues to work with his trainer during the week and has noticed continued improvement in his lower back pain and hip strength. Patient was able to tolerate all prescribed exercises with no adverse effects. Patient continues to benefit from skilled PT services and should be progressed as able to improve functional independence.   EVAL: Staley is a 73 y.o. male who was seen today for physical therapy evaluation and treatment for signs and symptoms consistent with Low Back pain and radicular symptoms. He has no more than minimal LBP with activities at this time, and feels more limited by R LE pain. He is demonstrating decreased LS AROM, decreased BIL hip MMT scores, and diminished hamstring flexibility BIL.  He has related pain and difficulty with participating  in normal recreational activities, heavy lifting, prolonged standing and walking. He requires skilled PT services at this time to address relevant deficits and improve overall function.     OBJECTIVE IMPAIRMENTS: decreased activity tolerance, decreased mobility, decreased ROM, decreased strength, and pain.   ACTIVITY LIMITATIONS: carrying, lifting, bending, and standing  PARTICIPATION LIMITATIONS: cleaning, community activity, and golfing  PERSONAL FACTORS: Age and 1 comorbidity: arthritis, hx of inguinal hernia repair  are also affecting patient's functional outcome.   REHAB POTENTIAL: Good  CLINICAL DECISION MAKING: Stable/uncomplicated  EVALUATION COMPLEXITY: Low   GOALS: Goals reviewed with patient? No  SHORT TERM GOALS: Target date: 10/30/2023   Patient will be independent with initial home program for lower quarter mobility.  Baseline: provided at eval  Goal  status: INITIAL  2.  Patient will demonstrate improved LS AROM by at least 10% in all directions  Baseline: see objective measures  Goal status: INITIAL   LONG TERM GOALS: Target date: 11/20/2023   Patient will report ability to perform standing and walking activities for at least 30 minutes without exacerbation of symptoms.  Baseline: unable to tolerate standing > 10 min  Goal status: INITIAL  2.  Patient will demonstrate ability to perform floor to waist lifting of at least 25# using appropriate body mechanics and with no more than minimal pain in order to safely perform normal daily/occupational tasks.   Baseline:  Goal status: INITIAL  3.  Patient will report ability to return to golfing with no more than 2-3/10 NPRS in low back and R LE.  Baseline: unable to golf, pain with swinging Goal status: INITIAL  4. Patient will report improved overall functional ability with modified ODI score of 16% or lower Baseline: 36% Goal status: INITIAL   PLAN:  PT FREQUENCY: 1-2x/week  PT DURATION: 6 weeks  PLANNED INTERVENTIONS: 97164- PT Re-evaluation, 97110-Therapeutic exercises, 97530- Therapeutic activity, 97112- Neuromuscular re-education, 97535- Self Care, 40347- Manual therapy, G0283- Electrical stimulation (unattended), Patient/Family education, Taping, Dry Needling, Spinal mobilization, Cryotherapy, and Moist heat.  PLAN FOR NEXT SESSION: address LQ mobility, core stabilization progression with bending, reaching, rotation as tolerated   Berta Minor PTA  10/29/2023 10:39 AM

## 2023-10-31 ENCOUNTER — Ambulatory Visit: Payer: Medicare Other

## 2023-10-31 DIAGNOSIS — M5416 Radiculopathy, lumbar region: Secondary | ICD-10-CM | POA: Diagnosis not present

## 2023-10-31 DIAGNOSIS — M79604 Pain in right leg: Secondary | ICD-10-CM

## 2023-10-31 NOTE — Therapy (Addendum)
 OUTPATIENT PHYSICAL THERAPY TREATMENT NOTE   Patient Name: Daniel [name] MRN: [id number] DOB:[birth year], 73 y.o., male Today's Date: 10/31/2023  END OF SESSION:  PT End of Session - 10/31/23 0958     Visit Number 6    Number of Visits 13    Date for PT Re-Evaluation 11/20/23    Authorization Type MCR/BCBS    PT Start Time 1000    PT Stop Time 1043    PT Time Calculation (min) 43 min    Activity Tolerance Patient tolerated treatment well    Behavior During Therapy WFL for tasks assessed/performed             Past Medical History:  Diagnosis Date   Arthritis    Cancer (HCC)    skin, basal and squamous   Hyperlipemia    Pneumonia    Past Surgical History:  Procedure Laterality Date   INSERTION OF MESH Left 03/13/2021   Procedure: INSERTION OF MESH;  Surgeon: Axel Filler, MD;  Location: [facility] OR;  Service: General;  Laterality: Left;   TONSILLECTOMY     XI ROBOTIC ASSISTED INGUINAL HERNIA REPAIR WITH MESH Left 03/13/2021   Procedure: XI ROBOTIC ASSISTED LEFT INGUINAL HERNIA REPAIR WITH MESH;  Surgeon: Axel Filler, MD;  Location: [facility] OR;  Service: General;  Laterality: Left;   Patient Active Problem List   Diagnosis Date Noted   Degenerative lumbar spinal stenosis 10/17/2023   Low back pain 08/19/2023   Capsulitis of metatarsophalangeal (MTP) joint of right foot 08/19/2023   Inguinal hernia 09/13/2020   Nonallopathic lesion of lumbar region 05/15/2020   Piriformis syndrome of right side 02/08/2020   Right calf pain 01/04/2020   Right lateral epicondylitis 04/17/2019   Patellar contusion 12/29/2018    PCP: Andi Devon, MD  REFERRING PROVIDER: Judi Saa, DO  REFERRING DIAG:  M54.50 (ICD-10-CM) - Acute bilateral low back pain without sciatica     Rationale for Evaluation and Treatment: Rehabilitation  THERAPY DIAG:  Radiculopathy, lumbar region  Pain in right leg  ONSET DATE: 09/01/2023   SUBJECTIVE:                                                                                                                                                                                            SUBJECTIVE STATEMENT: Patient reports that he has about 2-3/10 pain today. He states that sometimes the pain flares up towards to end of the day.   EVAL: Patient reports to PT with R LE pain that began about 1 month ago without known injury. He received epidural injection on 09/01/23. He states that he doesn't experience significant LBP.  However, he does have R LE pain with heavy lifting, prolonged standing/walking, and participation in golf.   PERTINENT HISTORY:  Relevant PMHx includes arthritis, Inguinal hernia repair 03/2021  PAIN:  Are you having pain?  Yes: NPRS scale: 4/10 since epidural Pain location: lateral R LE (glutes to ankle) Pain description: radicular, sharp (prior to epidural), dull (since injection)   Aggravating factors: standing  Relieving factors: epidural injection  PRECAUTIONS: None  RED FLAGS: Bowel or bladder incontinence: No and Cauda equina syndrome: No   WEIGHT BEARING RESTRICTIONS: No  FALLS:  Has patient fallen in last 6 months? No  LIVING ENVIRONMENT: Lives with: lives with their spouse Lives in: House/apartment Stairs:  Yes with railing  OCCUPATION: retired  PLOF: Independent  PATIENT GOALS: Would like to return to PLOF with pain - return to walking, golfing, gym with personal trainer 3x/week   NEXT MD VISIT: plans to f/u 6 weeks after epidural injection  OBJECTIVE:  Note: Objective measures were completed at Evaluation unless otherwise noted.  DIAGNOSTIC FINDINGS:  09/17/2023 MRI Lumbar Spine   IMPRESSION 1. Moderate subarticular narrowing at L4-5 is worse on the right, likely impacting the L5 nerve roots. 2. 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 3. Moderate right subarticular narrowing and mild right foraminal stenosis at L5-S1, likely  impacting the right S1 nerve roots. 4. Edematous type 1 Modic changes along the superior endplate of L5 on the right, suggesting more acute changes.  PATIENT SURVEYS:  Modified Oswestry 18/50 = 36%   COGNITION: Overall cognitive status: Within functional limits for tasks assessed     SENSATION: Not tested   POSTURE: decreased lumbar lordosis  LUMBAR ROM:   AROM eval  Flexion 75%  Extension 20%  Right lateral flexion 50%  Left lateral flexion 50%  Right rotation   Left rotation    (Blank rows = not tested)  LOWER EXTREMITY ROM:   Hamstring:  limited moderately BIL, impacting SLR test and slump test as noted below   Active  Right eval Left eval  Hip flexion    Hip extension    Hip abduction    Hip adduction    Hip internal rotation    Hip external rotation    Knee flexion    Knee extension    Ankle dorsiflexion    Ankle plantarflexion    Ankle inversion    Ankle eversion     (Blank rows = not tested)  LOWER EXTREMITY MMT:    MMT Right eval Left eval  Hip flexion 5 4+  Hip extension 4+ 5  Hip abduction 5 4+  Hip adduction 5 5  Hip internal rotation    Hip external rotation    Knee flexion 5 5  Knee extension 5 5  Ankle dorsiflexion    Ankle plantarflexion    Ankle inversion    Ankle eversion     (Blank rows = not tested)  LUMBAR SPECIAL TESTS:  BIL Straight leg raise test: Negative, Slump test: limited by hamstring tightness BIL , and FABER test: Negative  FUNCTIONAL TESTS:  To be assessed at f/u visit  GAIT: Distance walked: 50 feet from lobby to evaluation room  Assistive device utilized: None Level of assistance: Complete Independence Comments: no significant gait deviations noted at time of initial eval   TREATMENT DATE:  [facility] Adult PT Treatment:  DATE: 10/31/2023  Therapeutic Exercise: Nustep level 3 x 5 min Standing hip abduction/extension RTB at ankles 2x10 BIL Shoulder extension @FM   with bar 23# 2x10  Seated hamstring stretch 2x30" BIL Bridges on pball 2x10 LTR on pball x10 BIL  Therapeutic Activity:  Sled push/pull 65# 2 laps x20 ft Paloff press with rotation 17# x10 BIL Chops in half kneeling on airex pad, 7#, 2 x 10  Isometric walkouts, side stepping 7#, 2 x 5  Updated HEP and reviewed with patient   [facility] Adult PT Treatment:                                                DATE: 10/29/23 Therapeutic Exercise: Nustep level 6 x 5 min Standing hip abduction/extension RTB at ankles 2x10 BIL Sled push/pull 50# 2 laps x20 ft Shoulder extension @FM  with bar 23# 2x10 - cues for TA activation Palloff press with rotation 17# x10 BIL Seated hamstring stretch 2x30" BIL Bridges on pball 2x10 LTR on pball x10 BIL POE x2' Prone press ups x10 Prone hip extension x10 BIL   [facility] Adult PT Treatment:                                                DATE: 10/22/23 Therapeutic Exercise: Nustep level 6 x 5 min Standing hip abduction/extension RTB at ankles 2x10 BIL Standing 3 way hip on blue oval foam x10 BIL Sled push/pull 40# 2 laps x20 ft Palloff press with rotation 17# x10 BIL Seated hamstring stretch 2x30" BIL Bridges on pball 2x10 Supine sciatic nerve glide RLE x20 POE x2' Prone press ups 2x10 Prone hip extension 2x10 BIL Modified thomas stretch EOM x1' BIL      PATIENT EDUCATION:  Education details: reviewed initial home exercise program; discussion of POC, prognosis and goals for skilled PT  Person educated: Patient Education method: Explanation, Demonstration, and Handouts Education comprehension: verbalized understanding, returned demonstration, and needs further education  HOME EXERCISE PROGRAM: Access Code: [email or web address] URL: [email or web address] Date: 10/09/2023 Prepared by: Mauri Reading  Exercises - Seated Calf Stretch with Strap  - 2 x daily - 7 x weekly - 3 sets - 20-30 sec hold - Seated Hamstring Stretch  - 2 x daily - 7 x weekly - 3  sets - 20-30 sec hold - Seated Lumbar Flexion Stretch  - 2 x daily - 7 x weekly - 2 sets - 10 reps  ASSESSMENT:  CLINICAL IMPRESSION: 10/31/2023 Patient continues to respond well to skilled PT interventions. He was able to tolerate increased resistance today as well as addition of isometric core strengthening activities. He does report some fear with return to full golf swing. He is comfortable with putting and chipping, but is nervous about bringing his hips and back into a full swing.  Updated HEP to improved strength and confidence with trunk rotation. Patient was able to tolerate all prescribed exercises with no adverse effects. Patient continues to benefit from skilled PT services and should be progressed as able to improve functional independence.   EVAL: Ruairi is a 73 y.o. male who was seen today for physical therapy evaluation and treatment for signs and symptoms consistent with Low Back pain and radicular symptoms. He has no more than  minimal LBP with activities at this time, and feels more limited by R LE pain. He is demonstrating decreased LS AROM, decreased BIL hip MMT scores, and diminished hamstring flexibility BIL. He has related pain and difficulty with participating  in normal recreational activities, heavy lifting, prolonged standing and walking. He requires skilled PT services at this time to address relevant deficits and improve overall function.     OBJECTIVE IMPAIRMENTS: decreased activity tolerance, decreased mobility, decreased ROM, decreased strength, and pain.   ACTIVITY LIMITATIONS: carrying, lifting, bending, and standing  PARTICIPATION LIMITATIONS: cleaning, community activity, and golfing  PERSONAL FACTORS: Age and 1 comorbidity: arthritis, hx of inguinal hernia repair  are also affecting patient's functional outcome.   REHAB POTENTIAL: Good  CLINICAL DECISION MAKING: Stable/uncomplicated  EVALUATION COMPLEXITY: Low   GOALS: Goals reviewed with patient?  No  SHORT TERM GOALS: Target date: 10/30/2023   Patient will be independent with initial home program for lower quarter mobility.  Baseline: provided at eval  Goal status: INITIAL  2.  Patient will demonstrate improved LS AROM by at least 10% in all directions  Baseline: see objective measures  Goal status: INITIAL   LONG TERM GOALS: Target date: 11/20/2023   Patient will report ability to perform standing and walking activities for at least 30 minutes without exacerbation of symptoms.  Baseline: unable to tolerate standing > 10 min  Goal status: INITIAL  2.  Patient will demonstrate ability to perform floor to waist lifting of at least 25# using appropriate body mechanics and with no more than minimal pain in order to safely perform normal daily/occupational tasks.   Baseline:  Goal status: INITIAL  3.  Patient will report ability to return to golfing with no more than 2-3/10 NPRS in low back and R LE.  Baseline: unable to golf, pain with swinging Goal status: INITIAL  4. Patient will report improved overall functional ability with modified ODI score of 16% or lower Baseline: 36% Goal status: INITIAL   PLAN:  PT FREQUENCY: 1-2x/week  PT DURATION: 6 weeks  PLANNED INTERVENTIONS: 97164- PT Re-evaluation, 97110-Therapeutic exercises, 97530- Therapeutic activity, 97112- Neuromuscular re-education, 97535- Self Care, 01027- Manual therapy, [id number]- Electrical stimulation (unattended), Patient/Family education, Taping, Dry Needling, Spinal mobilization, Cryotherapy, and Moist heat.  PLAN FOR NEXT SESSION: address LQ mobility, core stabilization progression with bending, reaching, rotation as tolerated   Mauri Reading, PT, DPT  10/31/2023 12:12 PM

## 2023-11-04 NOTE — Progress Notes (Unsigned)
 [facility] [address] [place] [address] Phone: [phone] Subjective:   Daniel [name], am serving as a scribe for Dr. Antoine Primas.  I'm seeing this patient by the request  of:  Andi Devon, MD  CC: Low back pain  [id number]  10/17/2023 Degenerative spinal stenosis.  Discussed icing regimen and home exercises, discussed with patient that I do think he will do well and can start to transition into more aggressive therapy.  Did discuss avoiding significant extension.  Increase activity slowly otherwise.  Follow-up again 2 to 3 months.     Updated 11/06/2023 Daniel [name] is a 73 y.o. male coming in with complaint of back pain. Epi on 09/25/2023.Patient states that he was doing great but his pain has started to come back over the past 2 days. Did not start gabapentin.   Saw surgeon yesterday and he did not recommend surgery at this time.   Wants to discuss calcium score.   Recently was seen at [facility]     Past Medical History:  Diagnosis Date   Arthritis    Cancer (HCC)    skin, basal and squamous   Hyperlipemia    Pneumonia    Past Surgical History:  Procedure Laterality Date   INSERTION OF MESH Left 03/13/2021   Procedure: INSERTION OF MESH;  Surgeon: Axel Filler, MD;  Location: [facility] OR;  Service: General;  Laterality: Left;   TONSILLECTOMY     XI ROBOTIC ASSISTED INGUINAL HERNIA REPAIR WITH MESH Left 03/13/2021   Procedure: XI ROBOTIC ASSISTED LEFT INGUINAL HERNIA REPAIR WITH MESH;  Surgeon: Axel Filler, MD;  Location: [facility] OR;  Service: General;  Laterality: Left;   Social History   Socioeconomic History   Marital status: Married    Spouse name: Not on file   Number of children: Not on file   Years of education: Not on file   Highest education level: Not on file  Occupational History   Not on file  Tobacco Use   Smoking status: Never   Smokeless tobacco: Never  Vaping Use   Vaping status: Never Used  Substance  and Sexual Activity   Alcohol use: Yes    Alcohol/week: 14.0 standard drinks of alcohol    Types: 14 Glasses of wine per week   Drug use: Not on file   Sexual activity: Not on file  Other Topics Concern   Not on file  Social History Narrative   Not on file   Social Drivers of Health   Financial Resource Strain: Not on file  Food Insecurity: Not on file  Transportation Needs: Not on file  Physical Activity: Not on file  Stress: Not on file  Social Connections: Not on file   Allergies  Allergen Reactions   Shellfish Allergy Anaphylaxis, Swelling and Rash    Throat closes   No family history on file.  Current Outpatient Medications (Endocrine & Metabolic):    testosterone cypionate (DEPOTESTOSTERONE CYPIONATE) 200 MG/ML injection, Inject 80 mg into the muscle every Tuesday.  Current Outpatient Medications (Cardiovascular):    simvastatin (ZOCOR) 20 MG tablet, Take 1 tablet (20 mg total) by mouth daily.   Current Outpatient Medications (Analgesics):    ibuprofen (ADVIL) 200 MG tablet, Take 400 mg by mouth every 8 (eight) hours as needed (soreness for golfing).  Current Outpatient Medications (Hematological):    cyanocobalamin (,VITAMIN B-12,) 1000 MCG/ML injection, Inject 1,000 mcg into the muscle every Tuesday.  Current Outpatient Medications (Other):  ALPRAZolam (XANAX) 0.5 MG tablet, Take 0.5 mg by mouth See admin instructions. Take 1 tablet (0.5 mg) by mouth if needed for flight when needed   Cholecalciferol (VITAMIN D-3) 125 MCG (5000 UT) TABS, Take 5,000 Units by mouth in the morning and at bedtime.   econazole nitrate 1 % cream, Apply 1 application topically 2 (two) times daily as needed (rash/ringworm).   gabapentin (NEURONTIN) 100 MG capsule, Take 2 capsules (200 mg total) by mouth at bedtime.   Multiple Vitamin (MULTIVITAMIN WITH MINERALS) TABS tablet, Take 1 tablet by mouth in the morning.   Red Yeast Rice Extract (RED YEAST RICE PO), Take by mouth.   vitamin C  (ASCORBIC ACID) 500 MG tablet, Take 500 mg by mouth in the morning and at bedtime.   Reviewed prior external information including notes and imaging from  primary care provider As well as notes that were available from care everywhere and other healthcare systems.  Reviewed coronary calcium score  Past medical history, social, surgical and family history all reviewed in electronic medical record.  No pertanent information unless stated regarding to the chief complaint.    Objective  Blood pressure 118/84, pulse 92, height 6' (1.829 m), weight 189 lb (85.7 kg), SpO2 96%.   General: No apparent distress alert and oriented x3 mood and affect normal, dressed appropriately.  HEENT: Pupils equal, extraocular movements intact  Respiratory: Patient's speak in full sentences and does not appear short of breath  Cardiovascular: No lower extremity edema, non tender, no erythema  Sitting comfortably overall.  Had to change positions a couple times while we were talking to him.  No true radicular symptoms noted on exam today    Impression and Recommendations:     The above documentation has been reviewed and is accurate and complete Judi Saa, DO

## 2023-11-05 ENCOUNTER — Ambulatory Visit: Payer: Medicare Other

## 2023-11-06 ENCOUNTER — Ambulatory Visit (INDEPENDENT_AMBULATORY_CARE_PROVIDER_SITE_OTHER): Payer: Medicare Other | Admitting: Family Medicine

## 2023-11-06 ENCOUNTER — Encounter: Payer: Self-pay | Admitting: Family Medicine

## 2023-11-06 VITALS — BP 118/84 | HR 92 | Ht 72.0 in | Wt 189.0 lb

## 2023-11-06 DIAGNOSIS — R931 Abnormal findings on diagnostic imaging of heart and coronary circulation: Secondary | ICD-10-CM | POA: Insufficient documentation

## 2023-11-06 DIAGNOSIS — M5416 Radiculopathy, lumbar region: Secondary | ICD-10-CM

## 2023-11-06 DIAGNOSIS — M48061 Spinal stenosis, lumbar region without neurogenic claudication: Secondary | ICD-10-CM | POA: Diagnosis not present

## 2023-11-06 MED ORDER — SIMVASTATIN 20 MG PO TABS: 20.0000 mg | ORAL_TABLET | Freq: Every day | ORAL | 0 refills | Status: AC

## 2023-11-06 NOTE — Assessment & Plan Note (Signed)
 Moderate to severe.  Do think the microdiscectomy would be a potential offer if patient needs to surgical intervention but with patient responding initially to the injections I am hopeful discussed icing regimen and home exercises otherwise, discussed which activities to do and which ones to avoid.  Increase activity slowly over the course of next several weeks.  Discussed icing regimen.  Follow-up with me again in 6 to 8 weeks otherwise.  Patient will have another epidural and see if patient responds well to it.  No other changes in medication.

## 2023-11-06 NOTE — Patient Instructions (Addendum)
 Simvastatin 20mg  daily  Stop red yeast rice Epidural  Have a great Easter trip See me 6 weeks after the injection

## 2023-11-06 NOTE — Assessment & Plan Note (Signed)
 In the 90 percentile.  Started on simvastatin after a long conversation.  Patient is going to follow-up with cardiologist as well.

## 2023-11-07 ENCOUNTER — Ambulatory Visit: Payer: Medicare Other

## 2023-11-12 ENCOUNTER — Ambulatory Visit: Payer: Medicare Other | Attending: Family Medicine

## 2023-11-12 DIAGNOSIS — M79604 Pain in right leg: Secondary | ICD-10-CM | POA: Diagnosis present

## 2023-11-12 DIAGNOSIS — M5416 Radiculopathy, lumbar region: Secondary | ICD-10-CM | POA: Diagnosis present

## 2023-11-12 NOTE — Therapy (Addendum)
 " OUTPATIENT PHYSICAL THERAPY TREATMENT NOTE   Patient Name: Daniel [name] MRN: [id number] DOB:1951-01-17, 73 y.o., male Today's Date: 11/12/2023  PHYSICAL THERAPY DISCHARGE SUMMARY  Visits from Start of Care: 7  Patient is being discharged due to not returning since last visit (>3 months).   END OF SESSION:    Past Medical History:  Diagnosis Date   Arthritis    Cancer (HCC)    skin, basal and squamous   Hyperlipemia    Pneumonia    Past Surgical History:  Procedure Laterality Date   INSERTION OF MESH Left 03/13/2021   Procedure: INSERTION OF MESH;  Surgeon: Rubin Calamity, MD;  Location: [facility] OR;  Service: General;  Laterality: Left;   TONSILLECTOMY     XI ROBOTIC ASSISTED INGUINAL HERNIA REPAIR WITH MESH Left 03/13/2021   Procedure: XI ROBOTIC ASSISTED LEFT INGUINAL HERNIA REPAIR WITH MESH;  Surgeon: Rubin Calamity, MD;  Location: [facility] OR;  Service: General;  Laterality: Left;   Patient Active Problem List   Diagnosis Date Noted   Elevated coronary artery calcium score 11/06/2023   Degenerative lumbar spinal stenosis 10/17/2023   Low back pain 08/19/2023   Capsulitis of metatarsophalangeal (MTP) joint of right foot 08/19/2023   Inguinal hernia 09/13/2020   Nonallopathic lesion of lumbar region 05/15/2020   Piriformis syndrome of right side 02/08/2020   Right calf pain 01/04/2020   Right lateral epicondylitis 04/17/2019   Patellar contusion 12/29/2018    PCP: Theo Iha, MD  REFERRING PROVIDER: Claudene Arthea HERO, DO  REFERRING DIAG:  M54.50 (ICD-10-CM) - Acute bilateral low back pain without sciatica     Rationale for Evaluation and Treatment: Rehabilitation  THERAPY DIAG:  Radiculopathy, lumbar region  Pain in right leg  ONSET DATE: 09/01/2023   SUBJECTIVE:                                                                                                                                                                                           SUBJECTIVE  STATEMENT: Patient reports that he did follow up with Dr. Claudene and was seen by a surgeon at [facility]. He was recommended to see a chiropractor for lumbar traction and to continue with conservative treatment at this time. No plans to schedule surgery at this time.   EVAL: Patient reports to PT with R LE pain that began about 1 month ago without known injury. He received epidural injection on 09/01/23. He states that he doesn't experience significant LBP. However, he does have R LE pain with heavy lifting, prolonged standing/walking, and participation in golf.   PERTINENT HISTORY:  Relevant PMHx includes arthritis, Inguinal hernia repair 03/2021  PAIN:  Are you having pain?  Yes: NPRS scale: 4/10 since epidural Pain location: lateral R LE (glutes to ankle) Pain description: radicular, sharp (prior to epidural), dull (since injection)   Aggravating factors: standing  Relieving factors: epidural injection  PRECAUTIONS: None  RED FLAGS: Bowel or bladder incontinence: No and Cauda equina syndrome: No   WEIGHT BEARING RESTRICTIONS: No  FALLS:  Has patient fallen in last 6 months? No  LIVING ENVIRONMENT: Lives with: lives with their spouse Lives in: House/apartment Stairs: Yes with railing  OCCUPATION: retired  PLOF: Independent  PATIENT GOALS: Would like to return to PLOF with pain - return to walking, golfing, gym with personal trainer 3x/week   NEXT MD VISIT: plans to f/u 6 weeks after epidural injection  OBJECTIVE:  Note: Objective measures were completed at Evaluation unless otherwise noted.  DIAGNOSTIC FINDINGS:  09/17/2023 MRI Lumbar Spine   IMPRESSION 1. Moderate subarticular narrowing at L4-5 is worse on the right, likely impacting the L5 nerve roots. 2. 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 3. Moderate right subarticular narrowing and mild right foraminal stenosis at L5-S1, likely impacting the right S1 nerve  roots. 4. Edematous type 1 Modic changes along the superior endplate of L5 on the right, suggesting more acute changes.  PATIENT SURVEYS:  Modified Oswestry 18/50 = 36%   COGNITION: Overall cognitive status: Within functional limits for tasks assessed     SENSATION: Not tested   POSTURE: decreased lumbar lordosis  LUMBAR ROM:   AROM eval  Flexion 75%  Extension 20%  Right lateral flexion 50%  Left lateral flexion 50%  Right rotation   Left rotation    (Blank rows = not tested)  LOWER EXTREMITY ROM:   Hamstring:  limited moderately BIL, impacting SLR test and slump test as noted below   Active  Right eval Left eval  Hip flexion    Hip extension    Hip abduction    Hip adduction    Hip internal rotation    Hip external rotation    Knee flexion    Knee extension    Ankle dorsiflexion    Ankle plantarflexion    Ankle inversion    Ankle eversion     (Blank rows = not tested)  LOWER EXTREMITY MMT:    MMT Right eval Left eval  Hip flexion 5 4+  Hip extension 4+ 5  Hip abduction 5 4+  Hip adduction 5 5  Hip internal rotation    Hip external rotation    Knee flexion 5 5  Knee extension 5 5  Ankle dorsiflexion    Ankle plantarflexion    Ankle inversion    Ankle eversion     (Blank rows = not tested)  LUMBAR SPECIAL TESTS:  BIL Straight leg raise test: Negative, Slump test: limited by hamstring tightness BIL , and FABER test: Negative  FUNCTIONAL TESTS:  To be assessed at f/u visit  GAIT: Distance walked: 50 feet from lobby to evaluation room  Assistive device utilized: None Level of assistance: Complete Independence Comments: no significant gait deviations noted at time of initial eval   TREATMENT DATE:  [facility] Adult PT Treatment:                                                DATE: 11/12/2023  Therapeutic Exercise: Nustep incline 8 x 5 min Standing hip abduction/extension RTB at ankles 2x10 BIL Seated hamstring stretch 3x30 BIL Bridges on pball  2x10 LTR on pball x10 BIL Side plank, 4 x 10sec each side  Therapeutic Activity: Sled push/pull 65# 3 laps x20 ft with ankle weights Paloff press with rotation 17# x15 BIL Consider increased resistance at next visit  Isometric walkouts, side stepping 13# x 5, 10# x 5 each side  Consider resuming half kneel or standing chops at next visi t    [facility] Adult PT Treatment:                                                DATE: 10/31/2023  Therapeutic Exercise: Nustep level 3 x 5 min Standing hip abduction/extension RTB at ankles 2x10 BIL Shoulder extension @FM  with bar 23# 2x10  Seated hamstring stretch 2x30 BIL Bridges on pball 2x10 LTR on pball x10 BIL  Therapeutic Activity:  Sled push/pull 65# 2 laps x20 ft Paloff press with rotation 17# x10 BIL Chops in half kneeling on airex pad, 7#, 2 x 10  Isometric walkouts, side stepping 7#, 2 x 5  Updated HEP and reviewed with patient   [facility] Adult PT Treatment:                                                DATE: 10/29/23 Therapeutic Exercise: Nustep level 6 x 5 min Standing hip abduction/extension RTB at ankles 2x10 BIL Sled push/pull 50# 2 laps x20 ft Shoulder extension @FM  with bar 23# 2x10 - cues for TA activation Palloff press with rotation 17# x10 BIL Seated hamstring stretch 2x30 BIL Bridges on pball 2x10 LTR on pball x10 BIL POE x2' Prone press ups x10 Prone hip extension x10 BIL       PATIENT EDUCATION:  Education details: reviewed initial home exercise program; discussion of POC, prognosis and goals for skilled PT  Person educated: Patient Education method: Explanation, Demonstration, and Handouts Education comprehension: verbalized understanding, returned demonstration, and needs further education  HOME EXERCISE PROGRAM: Access Code: [email or web address] URL: [email or web address] Date: 10/09/2023 Prepared by: Marko Molt  Exercises - Seated Calf Stretch with Strap  - 2 x daily - 7 x weekly - 3 sets -  20-30 sec hold - Seated Hamstring Stretch  - 2 x daily - 7 x weekly - 3 sets - 20-30 sec hold - Seated Lumbar Flexion Stretch  - 2 x daily - 7 x weekly - 2 sets - 10 reps  ASSESSMENT:  CLINICAL IMPRESSION: 11/12/2023 Patient continues to respond well to progression of exercises. We will continue to build stabilization and confidence with functional motions, in order to improve return to PLOF.    EVAL: Daniel [name] is a 73 y.o. male who was seen today for physical therapy evaluation and treatment for signs and symptoms consistent with Low Back pain and radicular symptoms. He has no more than minimal LBP with activities at this time, and feels more limited by R LE pain. He is demonstrating decreased LS AROM, decreased BIL hip MMT scores, and diminished hamstring flexibility BIL. He has related pain and difficulty with participating  in normal recreational activities, heavy lifting, prolonged standing and walking. He requires skilled PT  services at this time to address relevant deficits and improve overall function.     OBJECTIVE IMPAIRMENTS: decreased activity tolerance, decreased mobility, decreased ROM, decreased strength, and pain.   ACTIVITY LIMITATIONS: carrying, lifting, bending, and standing  PARTICIPATION LIMITATIONS: cleaning, community activity, and golfing  PERSONAL FACTORS: Age and 1 comorbidity: arthritis, hx of inguinal hernia repair are also affecting patient's functional outcome.   REHAB POTENTIAL: Good  CLINICAL DECISION MAKING: Stable/uncomplicated  EVALUATION COMPLEXITY: Low   GOALS: Goals reviewed with patient? No  SHORT TERM GOALS: Target date: 10/30/2023   Patient will be independent with initial home program for lower quarter mobility.  Baseline: provided at eval  Goal status: INITIAL  2.  Patient will demonstrate improved LS AROM by at least 10% in all directions  Baseline: see objective measures  Goal status: INITIAL   LONG TERM GOALS: Target date:  11/20/2023   Patient will report ability to perform standing and walking activities for at least 30 minutes without exacerbation of symptoms.  Baseline: unable to tolerate standing > 10 min  Goal status: INITIAL  2.  Patient will demonstrate ability to perform floor to waist lifting of at least 25# using appropriate body mechanics and with no more than minimal pain in order to safely perform normal daily/occupational tasks.   Baseline:  Goal status: INITIAL  3.  Patient will report ability to return to golfing with no more than 2-3/10 NPRS in low back and R LE.  Baseline: unable to golf, pain with swinging Goal status: INITIAL  4. Patient will report improved overall functional ability with modified ODI score of 16% or lower Baseline: 36% Goal status: INITIAL   PLAN:  PT FREQUENCY: 1-2x/week  PT DURATION: 6 weeks  PLANNED INTERVENTIONS: 97164- PT Re-evaluation, 97110-Therapeutic exercises, 97530- Therapeutic activity, 97112- Neuromuscular re-education, 97535- Self Care, [id number]- Manual therapy, [id number]- Electrical stimulation (unattended), Patient/Family education, Taping, Dry Needling, Spinal mobilization, Cryotherapy, and Moist heat.  PLAN FOR NEXT SESSION: address LQ mobility, core stabilization progression with bending, reaching, rotation as tolerated   Marko Molt, PT, DPT  11/12/2023 9:56 AM   "

## 2023-11-14 ENCOUNTER — Ambulatory Visit: Payer: Medicare Other

## 2023-11-17 NOTE — Discharge Instructions (Signed)

## 2023-11-18 ENCOUNTER — Other Ambulatory Visit: Payer: Self-pay | Admitting: Family Medicine

## 2023-11-18 ENCOUNTER — Ambulatory Visit
Admission: RE | Admit: 2023-11-18 | Discharge: 2023-11-18 | Disposition: A | Discharge status: Home / Self Care | Source: Ambulatory Visit | Attending: Family Medicine | Admitting: Family Medicine

## 2023-11-18 DIAGNOSIS — M5416 Radiculopathy, lumbar region: Secondary | ICD-10-CM

## 2023-11-18 DIAGNOSIS — R931 Abnormal findings on diagnostic imaging of heart and coronary circulation: Secondary | ICD-10-CM

## 2023-11-18 DIAGNOSIS — M48061 Spinal stenosis, lumbar region without neurogenic claudication: Secondary | ICD-10-CM

## 2023-11-18 MED ORDER — METHYLPREDNISOLONE ACETATE 40 MG/ML INJ SUSP (RADIOLOG
80.0000 mg | Freq: Once | INTRAMUSCULAR | Status: AC
  Administered 2023-11-18: 80 mg via EPIDURAL

## 2023-11-18 MED ORDER — IOPAMIDOL (ISOVUE-M 200) INJECTION 41%
1.0000 mL | Freq: Once | INTRAMUSCULAR | Status: AC
  Administered 2023-11-18: 1 mL via EPIDURAL

## 2023-12-10 NOTE — Progress Notes (Signed)
 [name] [address] [place] [address] Phone: [phone]   Assessment and Plan:     1. Acute pain of left shoulder 2. Subacromial bursitis of left shoulder joint -Subacute, initial visit - Intermittent left shoulder pain for several weeks, worsening over the last 1 week consistent with subacromial bursitis, likely flared due to overuse and side sleeping - Daniel [name] obtained in clinic.  My interpretation: No acute fracture or dislocation.  Moderate to severe AC joint degenerative changes - Start HEP for rotator cuff - Start meloxicam  15 mg daily x2 weeks.  If still having pain after 2 weeks, complete 3rd-week of NSAID. May use remaining NSAID as needed once daily for pain control.  Do not to use additional over-the-counter NSAIDs (ibuprofen, naproxen, Advil, Aleve, etc.) while taking prescription NSAIDs.  May use Tylenol  [phone] mg 2 to 3 times a day for breakthrough pain.  15 additional minutes spent for educating Therapeutic Home Exercise Program.  This included exercises focusing on stretching, strengthening, with focus on eccentric aspects.   Long term goals include an improvement in range of motion, strength, endurance as well as avoiding reinjury. Patient's frequency would include in 1-2 times a day, 3-5 times a week for a duration of 6-12 weeks. Proper technique shown and discussed handout in great detail with ATC.  All questions were discussed and answered.     Pertinent previous records reviewed include none  Follow Up: As needed if no improvement in 2 weeks.  Could consider subacromial CSI versus physical therapy   Subjective:   I, Daniel [name], am serving as a Neurosurgeon for Doctor Daniel [name]  Chief Complaint: shoulder pain   HPI:   12/11/2023 Patient is a 73 year old male with shoulder pain. Patient states shoulder hurts today. Starting Sunday night it stated to get really bad. Thinks that he has been  compensating due to the sciatica. Took tylenol  on Sunday but nothing today. Pain is not waking him up at night recently but was waking him up Sunday and Monday night.   Duration?about 2 weeks ago Did you have an Injury to cause this pain? no Taking Medication for pain? no Numbness or Tingling? no Does the pain Radiate? no Altered gait or use? yes ROM/ impairment of movement? yes  Relevant Historical Information: None pertinent  Additional pertinent review of systems negative.   Current Outpatient Medications:    ALPRAZolam (XANAX) 0.5 MG tablet, Take 0.5 mg by mouth See admin instructions. Take 1 tablet (0.5 mg) by mouth if needed for flight when needed, Disp: , Rfl:    Cholecalciferol (VITAMIN D-3) 125 MCG (5000 UT) TABS, Take 5,000 Units by mouth in the morning and at bedtime., Disp: , Rfl:    cyanocobalamin (,VITAMIN B-12,) 1000 MCG/ML injection, Inject 1,000 mcg into the muscle every Tuesday., Disp: , Rfl:    econazole nitrate 1 % cream, Apply 1 application topically 2 (two) times daily as needed (rash/ringworm)., Disp: , Rfl:    gabapentin  (NEURONTIN ) 100 MG capsule, Take 2 capsules (200 mg total) by mouth at bedtime., Disp: 30 capsule, Rfl: 3   ibuprofen (ADVIL) 200 MG tablet, Take 400 mg by mouth every 8 (eight) hours as needed (soreness for golfing)., Disp: , Rfl:    meloxicam  (MOBIC ) 15 MG tablet, Take 1 tablet (15 mg total) by mouth daily., Disp: 14 tablet, Rfl: 0   Multiple Vitamin (MULTIVITAMIN WITH MINERALS) TABS tablet, Take 1 tablet by mouth in the morning., Disp: ,  Rfl:    Red Yeast Rice Extract (RED YEAST RICE PO), Take by mouth., Disp: , Rfl:    simvastatin  (ZOCOR ) 20 MG tablet, Take 1 tablet (20 mg total) by mouth daily., Disp: 90 tablet, Rfl: 0   testosterone cypionate (DEPOTESTOSTERONE CYPIONATE) 200 MG/ML injection, Inject 80 mg into the muscle every Tuesday., Disp: , Rfl:    vitamin C (ASCORBIC ACID) 500 MG tablet, Take 500 mg by mouth in the morning and at bedtime.,  Disp: , Rfl:    Objective:     Vitals:   12/11/23 1001  BP: (!) 140/88  Pulse: 86  SpO2: 96%  Height: 6' (1.829 m)      Body mass index is 25.63 kg/m.    Physical Exam:    Gen: Appears well, nad, nontoxic and pleasant Neuro:sensation intact, strength is 5/5 with df/pf/inv/ev, muscle tone wnl Skin: no suspicious lesion or defmority Psych: A&O, appropriate mood and affect  Left shoulder:  No deformity, swelling or muscle wasting No scapular winging FF 100, abd 100, int 10, ext 70 TTP anterior shoulder, biceps groove NTTP over the [place], clavicle, ac, coracoid,   humerus, deltoid, trapezius, cervical spine Positive Neer, Hawkins, empty can, O'Brien Neg  crossarm, subscap liftoff, speeds Neg ant drawer, sulcus sign, apprehension Negative Spurling's test bilat FROM of neck    Electronically signed by:  [name] 10:41 AM 12/11/23

## 2023-12-11 ENCOUNTER — Ambulatory Visit (INDEPENDENT_AMBULATORY_CARE_PROVIDER_SITE_OTHER): Admitting: Sports Medicine

## 2023-12-11 ENCOUNTER — Ambulatory Visit

## 2023-12-11 VITALS — BP 140/88 | HR 86 | Ht 72.0 in

## 2023-12-11 DIAGNOSIS — M7552 Bursitis of left shoulder: Secondary | ICD-10-CM | POA: Diagnosis not present

## 2023-12-11 DIAGNOSIS — M25512 Pain in left shoulder: Secondary | ICD-10-CM | POA: Diagnosis not present

## 2023-12-11 MED ORDER — MELOXICAM 15 MG PO TABS: 15.0000 mg | ORAL_TABLET | Freq: Every day | ORAL | 0 refills | Status: DC

## 2023-12-11 NOTE — Patient Instructions (Signed)
-   Start meloxicam  15 mg daily x2 weeks. May use remaining NSAID as needed once daily for pain control.  Do not to use additional over-the-counter NSAIDs (ibuprofen, naproxen, Advil, Aleve, etc.) while taking prescription NSAIDs.  May use Tylenol  [phone] mg 2 to 3 times a day for breakthrough pain. Shoulder HEP  As needed follow up if no improvement 2 week follow up

## 2023-12-12 ENCOUNTER — Encounter: Payer: Self-pay | Admitting: Sports Medicine

## 2024-01-02 NOTE — Progress Notes (Unsigned)
 [facility] [address] [place] [address] Phone: [phone] Subjective:   Delwyn Filippo, am serving as a scribe for Dr. Ronnell Coins.  I'm seeing this patient by the request  of:  Yolanda Hence, MD  CC: Low back pain  [id number]  11/06/2023 In the 90 percentile.  Started on simvastatin  after a long conversation.  Patient is going to follow-up with cardiologist as well.     Moderate to severe.  Do think the microdiscectomy would be a potential offer if patient needs to surgical intervention but with patient responding initially to the injections I am hopeful discussed icing regimen and home exercises otherwise, discussed which activities to do and which ones to avoid.  Increase activity slowly over the course of next several weeks.  Discussed icing regimen.  Follow-up with me again in 6 to 8 weeks otherwise.  Patient will have another epidural and see if patient responds well to it.  No other changes in medication.     Updated 01/08/2024 Frandy Basnett is a 73 y.o. male coming in with complaint of back pain, MRI found the patient did have severe right and moderate left foraminal stenosis at L4-L5 affecting the nerve roots as moderate right foraminal stenosis at L5-S1 causing a right S1 nerve root impingement. Epidural was helpful as well as decompression. Has slight numbness in bottom of his foot. Tingling over the top of foot that radiates into the great toe. Sharp pain has diminished.   Patient had undergone an epidural nerve root injection on the right L4 nerve root April 8.  Patient has been seen in neurosurgery and was to get a EMG done on June 9.  Past Medical History:  Diagnosis Date   Arthritis    Cancer (HCC)    skin, basal and squamous   Hyperlipemia    Pneumonia    Past Surgical History:  Procedure Laterality Date   INSERTION OF MESH Left 03/13/2021   Procedure: INSERTION OF MESH;  Surgeon: Shela Derby, MD;  Location: [facility]  OR;  Service: General;  Laterality: Left;   TONSILLECTOMY     XI ROBOTIC ASSISTED INGUINAL HERNIA REPAIR WITH MESH Left 03/13/2021   Procedure: XI ROBOTIC ASSISTED LEFT INGUINAL HERNIA REPAIR WITH MESH;  Surgeon: Shela Derby, MD;  Location: [facility] OR;  Service: General;  Laterality: Left;   Social History   Socioeconomic History   Marital status: Married    Spouse name: Not on file   Number of children: Not on file   Years of education: Not on file   Highest education level: Not on file  Occupational History   Not on file  Tobacco Use   Smoking status: Never   Smokeless tobacco: Never  Vaping Use   Vaping status: Never Used  Substance and Sexual Activity   Alcohol use: Yes    Alcohol/week: 14.0 standard drinks of alcohol    Types: 14 Glasses of wine per week   Drug use: Not on file   Sexual activity: Not on file  Other Topics Concern   Not on file  Social History Narrative   Not on file   Social Drivers of Health   Financial Resource Strain: Not on file  Food Insecurity: Not on file  Transportation Needs: Not on file  Physical Activity: Not on file  Stress: Not on file  Social Connections: Not on file   Allergies  Allergen Reactions   Shellfish Allergy Anaphylaxis, Swelling and Rash    Throat  closes   No family history on file.  Current Outpatient Medications (Endocrine & Metabolic):    testosterone cypionate (DEPOTESTOSTERONE CYPIONATE) 200 MG/ML injection, Inject 80 mg into the muscle every Tuesday.  Current Outpatient Medications (Cardiovascular):    simvastatin  (ZOCOR ) 20 MG tablet, Take 1 tablet (20 mg total) by mouth daily.   Current Outpatient Medications (Analgesics):    ibuprofen (ADVIL) 200 MG tablet, Take 400 mg by mouth every 8 (eight) hours as needed (soreness for golfing).  Current Outpatient Medications (Hematological):    cyanocobalamin (,VITAMIN B-12,) 1000 MCG/ML injection, Inject 1,000 mcg into the muscle every Tuesday.  Current Outpatient  Medications (Other):    ALPRAZolam (XANAX) 0.5 MG tablet, Take 0.5 mg by mouth See admin instructions. Take 1 tablet (0.5 mg) by mouth if needed for flight when needed   Cholecalciferol (VITAMIN D-3) 125 MCG (5000 UT) TABS, Take 5,000 Units by mouth in the morning and at bedtime.   econazole nitrate 1 % cream, Apply 1 application topically 2 (two) times daily as needed (rash/ringworm).   Multiple Vitamin (MULTIVITAMIN WITH MINERALS) TABS tablet, Take 1 tablet by mouth in the morning.   vitamin C (ASCORBIC ACID) 500 MG tablet, Take 500 mg by mouth in the morning and at bedtime.   Reviewed prior external information including notes and imaging from  primary care provider As well as notes that were available from care everywhere and other healthcare systems.  Past medical history, social, surgical and family history all reviewed in electronic medical record.  No pertanent information unless stated regarding to the chief complaint.   Review of Systems:  No headache, visual changes, nausea, vomiting, diarrhea, constipation, dizziness, abdominal pain, skin rash, fevers, chills, night sweats, weight loss, swollen lymph nodes, body aches, joint swelling, chest pain, shortness of breath, mood changes. POSITIVE muscle aches  Objective  Blood pressure 124/86, pulse 94, height 6' (1.829 m), weight 193 lb (87.5 kg), SpO2 96%.   General: No apparent distress alert and oriented x3 mood and affect normal, dressed appropriately.  HEENT: Pupils equal, extraocular movements intact  Respiratory: Patient's speak in full sentences and does not appear short of breath  Cardiovascular: No lower extremity edema, non tender, no erythema  Low back exam shows patient does have some loss lordosis noted.  Patient does still have some difficulty going from a seated to standing position.  Still has significant weakness noted with 3+ out of 5 strength of dorsi flexion of the right foot compared to the contralateral side.   Neurovascularly intact distally otherwise.  No atrophy of the lower extremity at this time.  Still has a positive straight leg test on the right side.    Impression and Recommendations:    The above documentation has been reviewed and is accurate and complete Gilberta Peeters M Lidie Glade, DO

## 2024-01-08 ENCOUNTER — Ambulatory Visit (INDEPENDENT_AMBULATORY_CARE_PROVIDER_SITE_OTHER): Admitting: Family Medicine

## 2024-01-08 VITALS — BP 124/86 | HR 94 | Ht 72.0 in | Wt 193.0 lb

## 2024-01-08 DIAGNOSIS — M48061 Spinal stenosis, lumbar region without neurogenic claudication: Secondary | ICD-10-CM | POA: Diagnosis not present

## 2024-01-08 NOTE — Patient Instructions (Signed)
 Good to see you! We need to continue to watch the weakness in foot Keep working with your trainer Can start chipping in 2 weeks See you again in 6 weeks to make a decision

## 2024-01-08 NOTE — Assessment & Plan Note (Signed)
 Lumbar radiculopathy with foot drop improvement noted though of some of the strength with dorsi flexion of the foot.  We discussed with patient that we do need to continue to monitor.  Patient is doing decompression and has seen some improvement.  Not having much pain at the moment but unfortunately still not able to do certain things such as golfing on a regular basis.  We discussed with patient at great length that if we do not see significant improvement I would like to consider the possibility of surgical intervention.  Patient is in agreement with the plan.  Follow-up again in 6 to 8 weeks otherwise.

## 2024-02-18 NOTE — Progress Notes (Unsigned)
 [name] [address] [place] [address] Phone: [phone] Subjective:   Daniel [name] am a scribe for Dr. Claudene.   I'm seeing this patient by the request  of:  Theo Iha, MD  CC: Low back pain and leg pain follow-up  YEP:Dlagzrupcz  01/08/2024 Lumbar radiculopathy with foot drop improvement noted though of some of the strength with dorsi flexion of the foot.  We discussed with patient that we do need to continue to monitor.  Patient is doing decompression and has seen some improvement.  Not having much pain at the moment but unfortunately still not able to do certain things such as golfing on a regular basis.  We discussed with patient at great length that if we do not see significant improvement I would like to consider the possibility of surgical intervention.  Patient is in agreement with the plan.  Follow-up again in 6 to 8 weeks otherwise.     Updated 02/19/2024 Daniel [name] is a 73 y.o. male coming in with complaint of LBP. Patient states that his back is getting a little bit better all the time. Started walking again.  Feels like he is making progress.  Very slow.  Has been swinging a golf club at about 50% of the strength he normally would use.       Past Medical History:  Diagnosis Date   Arthritis    Cancer (HCC)    skin, basal and squamous   Hyperlipemia    Pneumonia    Past Surgical History:  Procedure Laterality Date   INSERTION OF MESH Left 03/13/2021   Procedure: INSERTION OF MESH;  Surgeon: Rubin Calamity, MD;  Location: [facility] OR;  Service: General;  Laterality: Left;   TONSILLECTOMY     XI ROBOTIC ASSISTED INGUINAL HERNIA REPAIR WITH MESH Left 03/13/2021   Procedure: XI ROBOTIC ASSISTED LEFT INGUINAL HERNIA REPAIR WITH MESH;  Surgeon: Rubin Calamity, MD;  Location: [facility] OR;  Service: General;  Laterality: Left;   Social History   Socioeconomic History   Marital status: Married    Spouse name: Not on file    Number of children: Not on file   Years of education: Not on file   Highest education level: Not on file  Occupational History   Not on file  Tobacco Use   Smoking status: Never   Smokeless tobacco: Never  Vaping Use   Vaping status: Never Used  Substance and Sexual Activity   Alcohol use: Yes    Alcohol/week: 14.0 standard drinks of alcohol    Types: 14 Glasses of wine per week   Drug use: Not on file   Sexual activity: Not on file  Other Topics Concern   Not on file  Social History Narrative   Not on file   Social Drivers of Health   Financial Resource Strain: Not on file  Food Insecurity: Not on file  Transportation Needs: Not on file  Physical Activity: Not on file  Stress: Not on file  Social Connections: Not on file   Allergies  Allergen Reactions   Shellfish Allergy Anaphylaxis, Swelling and Rash    Throat closes   No family history on file.  Current Outpatient Medications (Endocrine & Metabolic):    testosterone cypionate (DEPOTESTOSTERONE CYPIONATE) 200 MG/ML injection, Inject 80 mg into the muscle every Tuesday.  Current Outpatient Medications (Cardiovascular):    simvastatin  (ZOCOR ) 20 MG tablet, Take 1 tablet (20 mg total) by mouth daily.   Current Outpatient  Medications (Analgesics):    ibuprofen (ADVIL) 200 MG tablet, Take 400 mg by mouth every 8 (eight) hours as needed (soreness for golfing).  Current Outpatient Medications (Hematological):    cyanocobalamin (,VITAMIN B-12,) 1000 MCG/ML injection, Inject 1,000 mcg into the muscle every Tuesday.  Current Outpatient Medications (Other):    ALPRAZolam (XANAX) 0.5 MG tablet, Take 0.5 mg by mouth See admin instructions. Take 1 tablet (0.5 mg) by mouth if needed for flight when needed   Cholecalciferol (VITAMIN D-3) 125 MCG (5000 UT) TABS, Take 5,000 Units by mouth in the morning and at bedtime.   econazole nitrate 1 % cream, Apply 1 application topically 2 (two) times daily as needed (rash/ringworm).    Multiple Vitamin (MULTIVITAMIN WITH MINERALS) TABS tablet, Take 1 tablet by mouth in the morning.   vitamin C (ASCORBIC ACID) 500 MG tablet, Take 500 mg by mouth in the morning and at bedtime.   Reviewed prior external information including notes and imaging from  primary care provider As well as notes that were available from care everywhere and other healthcare systems.  Past medical history, social, surgical and family history all reviewed in electronic medical record.  No pertanent information unless stated regarding to the chief complaint.   Review of Systems:  No headache, visual changes, nausea, vomiting, diarrhea, constipation, dizziness, abdominal pain, skin rash, fevers, chills, night sweats, weight loss, swollen lymph nodes, body aches, joint swelling, chest pain, shortness of breath, mood changes. POSITIVE muscle aches  Objective  Blood pressure 124/70, pulse 86, height 6' (1.829 m), weight 191 lb 9.6 oz (86.9 kg), SpO2 96%.   General: No apparent distress alert and oriented x3 mood and affect normal, dressed appropriately.  HEENT: Pupils equal, extraocular movements intact  Respiratory: Patient's speak in full sentences and does not appear short of breath  Cardiovascular: No lower extremity edema, non tender, no erythema  Low back does have some loss lordosis noted.  Some tenderness to palpation in the paraspinal musculature.  Patient does have significant weakness still with 2 out of 5 strength dorsi flexion on the right side.  Otherwise strength is symmetric.    Impression and Recommendations:    The above documentation has been reviewed and is accurate and complete Daisy Lites M Lama Narayanan, DO

## 2024-02-19 ENCOUNTER — Ambulatory Visit: Admitting: Family Medicine

## 2024-02-19 ENCOUNTER — Encounter: Payer: Self-pay | Admitting: Family Medicine

## 2024-02-19 VITALS — BP 124/70 | HR 86 | Ht 72.0 in | Wt 191.6 lb

## 2024-02-19 DIAGNOSIS — M48061 Spinal stenosis, lumbar region without neurogenic claudication: Secondary | ICD-10-CM | POA: Diagnosis not present

## 2024-02-19 NOTE — Assessment & Plan Note (Signed)
 Significant overall and patient does have a right foot drop.  Very concerned about this overall.  Discussed with patient about different treatment options including the surgical intervention.  Patient feels like he is making improvement other than the weakness.  Patient states that it is only his foot and ankle.  Otherwise feels like everything else is improved.  Slowly increasing his activity but we would like to continue to monitor.  Follow-up with me again in 6 to 8 weeks.  Discussed even the potential for an AFO secondary to the foot drop.  Total time with patient 31 minutes

## 2024-02-19 NOTE — Patient Instructions (Addendum)
 Good to see you. Consider A carbon fiber. If you want it before the trip please call me.  Increase activity slowly. See me again after [place].

## 2024-04-27 NOTE — Progress Notes (Unsigned)
 [name] [address] [place] [address] Phone: [phone] Subjective:   Daniel [name], am serving as a scribe for Dr. Arthea Claudene.  I'm seeing this patient by the request  of:  Theo Iha, MD  CC: back pain   YEP:Dlagzrupcz  02/19/2024 Significant overall and patient does have a right foot drop.  Very concerned about this overall.  Discussed with patient about different treatment options including the surgical intervention.  Patient feels like he is making improvement other than the weakness.  Patient states that it is only his foot and ankle.  Otherwise feels like everything else is improved.  Slowly increasing his activity but we would like to continue to monitor.  Follow-up with me again in 6 to 8 weeks.  Discussed even the potential for an AFO secondary to the foot drop.  Total time with patient 31 minutes     Updated 04/28/2024 Daniel [name] is a 73 y.o. male coming in with complaint of back pain, that was associated with foot drop.  Noticing that the foot drop was improving.  Patient has been seeing chiropractor as well as increasing activity.  Unfortunately still was not feeling better overall and had to discontinue the trip he had planned.  Patient states back is doing well. No new symptoms. Making good progress.     Previous MRI of the lumbar spine showed a fairly large subarticular impingement noted at L4-L5 on the right side impinging the L5 nerve root  Past Medical History:  Diagnosis Date   Arthritis    Cancer (HCC)    skin, basal and squamous   Hyperlipemia    Pneumonia    Past Surgical History:  Procedure Laterality Date   INSERTION OF MESH Left 03/13/2021   Procedure: INSERTION OF MESH;  Surgeon: Rubin Calamity, MD;  Location: [facility] OR;  Service: General;  Laterality: Left;   TONSILLECTOMY     XI ROBOTIC ASSISTED INGUINAL HERNIA REPAIR WITH MESH Left 03/13/2021   Procedure: XI ROBOTIC ASSISTED LEFT INGUINAL HERNIA REPAIR  WITH MESH;  Surgeon: Rubin Calamity, MD;  Location: [facility] OR;  Service: General;  Laterality: Left;   Social History   Socioeconomic History   Marital status: Married    Spouse name: Not on file   Number of children: Not on file   Years of education: Not on file   Highest education level: Not on file  Occupational History   Not on file  Tobacco Use   Smoking status: Never   Smokeless tobacco: Never  Vaping Use   Vaping status: Never Used  Substance and Sexual Activity   Alcohol use: Yes    Alcohol/week: 14.0 standard drinks of alcohol    Types: 14 Glasses of wine per week   Drug use: Not on file   Sexual activity: Not on file  Other Topics Concern   Not on file  Social History Narrative   Not on file   Social Drivers of Health   Financial Resource Strain: Not on file  Food Insecurity: Not on file  Transportation Needs: Not on file  Physical Activity: Not on file  Stress: Not on file  Social Connections: Not on file   Allergies  Allergen Reactions   Shellfish Allergy Anaphylaxis, Swelling and Rash    Throat closes   No family history on file.  Current Outpatient Medications (Endocrine & Metabolic):    testosterone cypionate (DEPOTESTOSTERONE CYPIONATE) 200 MG/ML injection, Inject 80 mg into the muscle every Tuesday.  Current Outpatient Medications (Cardiovascular):    simvastatin  (ZOCOR ) 20 MG tablet, Take 1 tablet (20 mg total) by mouth daily.   Current Outpatient Medications (Analgesics):    ibuprofen (ADVIL) 200 MG tablet, Take 400 mg by mouth every 8 (eight) hours as needed (soreness for golfing).  Current Outpatient Medications (Hematological):    cyanocobalamin (,VITAMIN B-12,) 1000 MCG/ML injection, Inject 1,000 mcg into the muscle every Tuesday.  Current Outpatient Medications (Other):    AMBULATORY NON FORMULARY MEDICATION, 1 Units by Other route once for 1 dose.   ALPRAZolam (XANAX) 0.5 MG tablet, Take 0.5 mg by mouth See admin instructions. Take 1  tablet (0.5 mg) by mouth if needed for flight when needed   Cholecalciferol (VITAMIN D-3) 125 MCG (5000 UT) TABS, Take 5,000 Units by mouth in the morning and at bedtime.   econazole nitrate 1 % cream, Apply 1 application topically 2 (two) times daily as needed (rash/ringworm).   Multiple Vitamin (MULTIVITAMIN WITH MINERALS) TABS tablet, Take 1 tablet by mouth in the morning.   vitamin C (ASCORBIC ACID) 500 MG tablet, Take 500 mg by mouth in the morning and at bedtime.   Reviewed prior external information including notes and imaging from  primary care provider As well as notes that were available from care everywhere and other healthcare systems.  Past medical history, social, surgical and family history all reviewed in electronic medical record.  No pertanent information unless stated regarding to the chief complaint.   Review of Systems:  No headache, visual changes, nausea, vomiting, diarrhea, constipation, dizziness, abdominal pain, skin rash, fevers, chills, night sweats, weight loss, swollen lymph nodes, body aches, joint swelling, chest pain, shortness of breath, mood changes. POSITIVE muscle aches  Objective  Blood pressure 118/76, pulse 94, height 6' (1.829 m), weight 193 lb (87.5 kg), SpO2 97%.   General: No apparent distress alert and oriented x3 mood and affect normal, dressed appropriately.  HEENT: Pupils equal, extraocular movements intact  Respiratory: Patient's speak in full sentences and does not appear short of breath  Cardiovascular: No lower extremity edema, non tender, no erythema  Very mild antalgic gait noted but patient does have foot drop noted.  Significant weakness with 3 out of 5 strength noted. Patient is nontender on exam today.  Still has limited extension of the back noted though.   Impression and Recommendations:    The above documentation has been reviewed and is accurate and complete Daniel Baena M Clair Alfieri, DO

## 2024-04-28 ENCOUNTER — Ambulatory Visit (INDEPENDENT_AMBULATORY_CARE_PROVIDER_SITE_OTHER): Admitting: Family Medicine

## 2024-04-28 ENCOUNTER — Encounter: Payer: Self-pay | Admitting: Family Medicine

## 2024-04-28 VITALS — BP 118/76 | HR 94 | Ht 72.0 in | Wt 193.0 lb

## 2024-04-28 DIAGNOSIS — M21371 Foot drop, right foot: Secondary | ICD-10-CM | POA: Diagnosis not present

## 2024-04-28 DIAGNOSIS — M545 Low back pain, unspecified: Secondary | ICD-10-CM

## 2024-04-28 MED ORDER — AMBULATORY NON FORMULARY MEDICATION: 1.0000 [IU] | Freq: Once | 0 refills | Status: AC

## 2024-04-28 NOTE — Patient Instructions (Addendum)
 [facility] AFO [place] Increase activity and get to driving range Tell Debbie I told you about cold plunge See you again in 2 months

## 2024-04-28 NOTE — Assessment & Plan Note (Signed)
 Low back does have some loss of lordosis noted.  Will continue to monitor.  Patient is able to go from seated to standing without any difficulty.

## 2024-04-28 NOTE — Assessment & Plan Note (Signed)
 Does have foot drop noted.  Discussed with patient at this point I do not know how much strength he would have.  Still wants to avoid any surgical intervention on the back.  We did discuss still that if any type of atrophy occurs or worsening back pain he is going to need to consider some type of decompression.  At this point patient would rather try an AFO.  Will refer patient accordingly.  Encouraged patient to try to start increasing activity as well as starting his past time of golf.  We need to see how patient responds.  Okay to use

## 2024-06-29 NOTE — Progress Notes (Unsigned)
 [facility] [address] [place] [address] Phone: [phone] Subjective:   [name], am serving as a scribe for Dr. Arthea Claudene.  I'm seeing this patient by the request  of:  Theo Iha, MD  CC: Right foot weakness and low back pain  YEP:Dlagzrupcz  04/28/2024 Low back does have some loss of lordosis noted.  Will continue to monitor.  Patient is able to go from seated to standing without any difficulty.     Update 06/30/2024 Dartanian Knaggs is a 73 y.o. male coming in with complaint of lower back pain. Patient states has been doing okay. Okay with doing doing activities and hasn't played golf, but has been hitting balls. Last 3-4 weeks L heel pain that's dull. Right now not stopping him from doing activities.       Past Medical History:  Diagnosis Date   Arthritis    Cancer (HCC)    skin, basal and squamous   Hyperlipemia    Pneumonia    Past Surgical History:  Procedure Laterality Date   INSERTION OF MESH Left 03/13/2021   Procedure: INSERTION OF MESH;  Surgeon: Rubin Calamity, MD;  Location: [facility] OR;  Service: General;  Laterality: Left;   TONSILLECTOMY     XI ROBOTIC ASSISTED INGUINAL HERNIA REPAIR WITH MESH Left 03/13/2021   Procedure: XI ROBOTIC ASSISTED LEFT INGUINAL HERNIA REPAIR WITH MESH;  Surgeon: Rubin Calamity, MD;  Location: [facility] OR;  Service: General;  Laterality: Left;   Social History   Socioeconomic History   Marital status: Married    Spouse name: Not on file   Number of children: Not on file   Years of education: Not on file   Highest education level: Not on file  Occupational History   Not on file  Tobacco Use   Smoking status: Never   Smokeless tobacco: Never  Vaping Use   Vaping status: Never Used  Substance and Sexual Activity   Alcohol use: Yes    Alcohol/week: 14.0 standard drinks of alcohol    Types: 14 Glasses of wine per week   Drug use: Not on file   Sexual activity: Not on file   Other Topics Concern   Not on file  Social History Narrative   Not on file   Social Drivers of Health   Financial Resource Strain: Not on file  Food Insecurity: Not on file  Transportation Needs: Not on file  Physical Activity: Not on file  Stress: Not on file  Social Connections: Not on file   Allergies  Allergen Reactions   Shellfish Allergy Anaphylaxis, Swelling and Rash    Throat closes   No family history on file.  Current Outpatient Medications (Endocrine & Metabolic):    testosterone cypionate (DEPOTESTOSTERONE CYPIONATE) 200 MG/ML injection, Inject 80 mg into the muscle every Tuesday.  Current Outpatient Medications (Cardiovascular):    simvastatin  (ZOCOR ) 20 MG tablet, Take 1 tablet (20 mg total) by mouth daily.   Current Outpatient Medications (Analgesics):    ibuprofen (ADVIL) 200 MG tablet, Take 400 mg by mouth every 8 (eight) hours as needed (soreness for golfing).  Current Outpatient Medications (Hematological):    cyanocobalamin (,VITAMIN B-12,) 1000 MCG/ML injection, Inject 1,000 mcg into the muscle every Tuesday.  Current Outpatient Medications (Other):    ALPRAZolam (XANAX) 0.5 MG tablet, Take 0.5 mg by mouth See admin instructions. Take 1 tablet (0.5 mg) by mouth if needed for flight when needed   Cholecalciferol (VITAMIN D-3) 125  MCG (5000 UT) TABS, Take 5,000 Units by mouth in the morning and at bedtime.   econazole nitrate 1 % cream, Apply 1 application topically 2 (two) times daily as needed (rash/ringworm).   Multiple Vitamin (MULTIVITAMIN WITH MINERALS) TABS tablet, Take 1 tablet by mouth in the morning.   vitamin C (ASCORBIC ACID) 500 MG tablet, Take 500 mg by mouth in the morning and at bedtime.   Reviewed prior external information including notes and imaging from  primary care provider As well as notes that were available from care everywhere and other healthcare systems.  Past medical history, social, surgical and family history all  reviewed in electronic medical record.  No pertanent information unless stated regarding to the chief complaint.   Review of Systems:  No headache, visual changes, nausea, vomiting, diarrhea, constipation, dizziness, abdominal pain, skin rash, fevers, chills, night sweats, weight loss, swollen lymph nodes, body aches, joint swelling, chest pain, shortness of breath, mood changes. POSITIVE muscle aches improving  Objective  Blood pressure 120/84, pulse 84, height 6' (1.829 m), weight 191 lb (86.6 kg), SpO2 96%.   General: No apparent distress alert and oriented x3 mood and affect normal, dressed appropriately.  HEENT: Pupils equal, extraocular movements intact  Respiratory: Patient's speak in full sentences and does not appear short of breath  Cardiovascular: No lower extremity edema, non tender, no erythema  Low back exam shows patient does have very mild loss of lordosis but patient has improvement in the right foot drop with improvement in strength noted at the moment.  Still some mild atrophy though of the calf musculature. Foot exam shows left heel does have some breakdown of the skin noted.     Impression and Recommendations:     The above documentation has been reviewed and is accurate and complete Adley Castello M Jurgen Groeneveld, DO

## 2024-06-30 ENCOUNTER — Ambulatory Visit (INDEPENDENT_AMBULATORY_CARE_PROVIDER_SITE_OTHER): Admitting: Family Medicine

## 2024-06-30 VITALS — BP 120/84 | HR 84 | Ht 72.0 in | Wt 191.0 lb

## 2024-06-30 DIAGNOSIS — M21371 Foot drop, right foot: Secondary | ICD-10-CM

## 2024-06-30 DIAGNOSIS — M48061 Spinal stenosis, lumbar region without neurogenic claudication: Secondary | ICD-10-CM | POA: Diagnosis not present

## 2024-06-30 NOTE — Assessment & Plan Note (Signed)
 Improvement noted again.  This is making him feel better.  Not using the brace as much.  Increasing his activity but still not playing with the golf he would like.  Patient's hopeful that he will do some more in the next year but he is also not concerned in getting back to the level he was previously.  Is working out and continuing to work on designer, fashion/clothing.  Follow-up again in 6 to 12 weeks

## 2024-06-30 NOTE — Patient Instructions (Signed)
 You have 14 days to return or exchange your brace Call [phone], then return the brace to our office Moleskin in shoe Lotion lotion lotion See you again in 3 months

## 2024-06-30 NOTE — Assessment & Plan Note (Signed)
 Degenerative scoliosis noted.  Some loss of lordosis noted.  Will continue to monitor but patient does seem to be in a better place.  Not taking any medications really for pain at the moment.  Follow-up again in 6 to 12 weeks

## 2024-07-07 ENCOUNTER — Ambulatory Visit: Admitting: Family Medicine

## 2024-09-30 ENCOUNTER — Ambulatory Visit: Admitting: Family Medicine
